# Patient Record
Sex: Female | Born: 1945 | Race: White | Hispanic: No | Marital: Married | Smoking: Former smoker
Health system: Southern US, Community
[De-identification: ages and names within clinical notes are randomized; demographics above are authoritative.]

## PROBLEM LIST (undated history)

## (undated) DIAGNOSIS — E162 Hypoglycemia, unspecified: Secondary | ICD-10-CM

## (undated) DIAGNOSIS — O00109 Unspecified tubal pregnancy without intrauterine pregnancy: Secondary | ICD-10-CM

## (undated) DIAGNOSIS — H269 Unspecified cataract: Secondary | ICD-10-CM

## (undated) DIAGNOSIS — Z1211 Encounter for screening for malignant neoplasm of colon: Secondary | ICD-10-CM

## (undated) HISTORY — DX: Unspecified tubal pregnancy without intrauterine pregnancy: O00.109

## (undated) HISTORY — PX: CATARACT EXTRACTION W/ INTRAOCULAR LENS IMPLANT: SHX1309

## (undated) HISTORY — DX: Unspecified cataract: H26.9

## (undated) HISTORY — PX: ECTOPIC PREGNANCY SURGERY: SHX613

## (undated) HISTORY — DX: Encounter for screening for malignant neoplasm of colon: Z12.11

---

## 2011-02-07 LAB — HM COLONOSCOPY

## 2012-05-24 LAB — LIPID PANEL
Cholesterol: 252 — AB (ref 0–200)
LDL Cholesterol: 128
Triglycerides: 72 (ref 40–160)

## 2012-05-24 LAB — CBC AND DIFFERENTIAL
HCT: 39 (ref 36–46)
Hemoglobin: 12.9 (ref 12.0–16.0)
Platelets: 180 (ref 150–399)
WBC: 4.2

## 2012-05-24 LAB — IRON,TIBC AND FERRITIN PANEL
%SAT: 38
Ferritin: 133
Iron: 97
TIBC: 257

## 2012-05-24 LAB — HEPATIC FUNCTION PANEL
ALT: 28 (ref 7–35)
AST: 30 (ref 13–35)
Alkaline Phosphatase: 73 (ref 25–125)
Bilirubin, Total: 0.4

## 2012-05-24 LAB — BASIC METABOLIC PANEL
BUN: 25 — AB (ref 4–21)
CO2: 26 — AB (ref 13–22)
Chloride: 103 (ref 99–108)
Creatinine: 0.6 (ref 0.5–1.1)
Glucose: 86
Potassium: 4 (ref 3.4–5.3)
Sodium: 139 (ref 137–147)

## 2012-05-24 LAB — HEMOGLOBIN A1C: Hemoglobin A1C: 5.6

## 2012-05-24 LAB — COMPREHENSIVE METABOLIC PANEL
Albumin: 3.6 (ref 3.5–5.0)
Calcium: 9.1 (ref 8.7–10.7)
GFR calc Af Amer: 122
GFR calc non Af Amer: 101

## 2012-05-24 LAB — CBC: RBC: 4.34 (ref 3.87–5.11)

## 2012-05-24 LAB — TSH: TSH: 1.24 (ref 0.41–5.90)

## 2012-05-24 LAB — VITAMIN B12: Vitamin B-12: 1387

## 2012-05-24 LAB — VITAMIN D 25 HYDROXY (VIT D DEFICIENCY, FRACTURES): Vit D, 25-Hydroxy: 27

## 2014-06-15 LAB — LIPID PANEL
Cholesterol: 274 — AB (ref 0–200)
HDL: 117 — AB (ref 35–70)
LDL Cholesterol: 143
Triglycerides: 57 (ref 40–160)

## 2014-06-15 LAB — CBC AND DIFFERENTIAL
HCT: 42 (ref 36–46)
Hemoglobin: 13.5 (ref 12.0–16.0)
Platelets: 157 (ref 150–399)
WBC: 5.3

## 2014-06-15 LAB — COMPREHENSIVE METABOLIC PANEL
Albumin: 3.9 (ref 3.5–5.0)
Calcium: 9.6 (ref 8.7–10.7)
GFR calc Af Amer: 87
GFR calc non Af Amer: 72

## 2014-06-15 LAB — BASIC METABOLIC PANEL
BUN: 29 — AB (ref 4–21)
CO2: 31 — AB (ref 13–22)
Chloride: 101 (ref 99–108)
Creatinine: 0.8 (ref 0.5–1.1)
Glucose: 95
Potassium: 5.2 (ref 3.4–5.3)
Sodium: 136 — AB (ref 137–147)

## 2014-06-15 LAB — CBC: RBC: 4.61 (ref 3.87–5.11)

## 2014-06-15 LAB — HEPATIC FUNCTION PANEL
ALT: 29 (ref 7–35)
AST: 31 (ref 13–35)
Alkaline Phosphatase: 86 (ref 25–125)
Bilirubin, Total: 0.3

## 2014-06-15 LAB — VITAMIN D 25 HYDROXY (VIT D DEFICIENCY, FRACTURES): Vit D, 25-Hydroxy: 67

## 2014-06-15 LAB — TSH: TSH: 1.92 (ref 0.41–5.90)

## 2015-02-22 LAB — BASIC METABOLIC PANEL
BUN: 24 — AB (ref 4–21)
CO2: 28 — AB (ref 13–22)
Chloride: 97 — AB (ref 99–108)
Creatinine: 0.5 (ref 0.5–1.1)
Potassium: 4.3 (ref 3.4–5.3)
Sodium: 136 — AB (ref 137–147)

## 2015-02-22 LAB — HEPATIC FUNCTION PANEL
ALT: 20 (ref 7–35)
AST: 27 (ref 13–35)
Alkaline Phosphatase: 67 (ref 25–125)
Bilirubin, Total: 0.2

## 2015-02-22 LAB — CBC AND DIFFERENTIAL
HCT: 40 (ref 36–46)
Hemoglobin: 13 (ref 12.0–16.0)
Platelets: 133 — AB (ref 150–399)
WBC: 4.8

## 2015-02-22 LAB — CBC: RBC: 4.48 (ref 3.87–5.11)

## 2015-02-22 LAB — COMPREHENSIVE METABOLIC PANEL
Albumin: 4.2 (ref 3.5–5.0)
Calcium: 9 (ref 8.7–10.7)
GFR calc Af Amer: 117
GFR calc non Af Amer: 101

## 2015-02-22 LAB — LIPID PANEL
Cholesterol: 255 — AB (ref 0–200)
HDL: 115 — AB (ref 35–70)
LDL Cholesterol: 124
LDl/HDL Ratio: 1.1
Triglycerides: 48 (ref 40–160)

## 2015-02-22 LAB — VITAMIN D 25 HYDROXY (VIT D DEFICIENCY, FRACTURES): Vit D, 25-Hydroxy: 34

## 2017-01-14 ENCOUNTER — Encounter: Payer: Self-pay | Admitting: Internal Medicine

## 2017-10-17 ENCOUNTER — Other Ambulatory Visit: Payer: Self-pay

## 2017-10-17 ENCOUNTER — Ambulatory Visit
Admission: EM | Admit: 2017-10-17 | Discharge: 2017-10-17 | Disposition: A | Discharge status: Home / Self Care | Payer: Medicare Other

## 2017-10-17 ENCOUNTER — Encounter: Payer: Self-pay | Admitting: Emergency Medicine

## 2017-10-17 DIAGNOSIS — H02843 Edema of right eye, unspecified eyelid: Secondary | ICD-10-CM

## 2017-10-17 DIAGNOSIS — H1031 Unspecified acute conjunctivitis, right eye: Secondary | ICD-10-CM | POA: Diagnosis not present

## 2017-10-17 HISTORY — DX: Hypoglycemia, unspecified: E16.2

## 2017-10-17 MED ORDER — GENTAMICIN SULFATE 0.3 % OP SOLN: 2.0000 [drp] | Freq: Four times a day (QID) | OPHTHALMIC | 0 refills | Status: AC

## 2017-10-17 NOTE — ED Provider Notes (Signed)
[facility]    CSN: [id number] Arrival date & time: 10/17/17  1003     History   Chief Complaint Chief Complaint  Patient presents with  . Eye Pain    stye vs. conjunctivitis    HPI Mary Everett is a 71 y.o. female.   71 year old female presents with right eye irritation and discharge that started yesterday. Had noticed her right eyelid was swollen and red- uncertain if she was developing a stye. Woke up this morning with some yellowish drainage and itching. Denies any fever, blurred vision or URI symptoms. Does not wear contacts. Has not used eye drops or any other treatment for symptoms yet. Only other chronic health issue is seasonal allergies and low blood pressure and takes Zyrtec as needed.    The history is provided by the patient.    Past Medical History:  Diagnosis Date  . Hypoglycemia     There are no active problems to display for this patient.   Past Surgical History:  Procedure Laterality Date  . ECTOPIC PREGNANCY SURGERY      OB History    No data available       Home Medications    Prior to Admission medications   Medication Sig Start Date End Date Taking? Authorizing Provider  cetirizine (ZYRTEC) 10 MG tablet Take 10 mg by mouth daily as needed for allergies.   Yes [provider]  gentamicin (GARAMYCIN) 0.3 % ophthalmic solution Place 2 drops into the right eye 4 (four) times daily for 5 days. 10/17/17 10/22/17  Katy Apo, NP    Family History Family History  Problem Relation Age of Onset  . Cancer Mother 42       lung  . Cancer Father 11       brain    Social History Social History   Tobacco Use  . Smoking status: Former Smoker    Last attempt to quit: 10/17/1972    Years since quitting: 45.0  . Smokeless tobacco: Never Used  Substance Use Topics  . Alcohol use: No    Frequency: Never  . Drug use: No     Allergies   Sulfa antibiotics   Review of Systems Review of Systems  Constitutional:  Negative for activity change, appetite change, chills, fatigue and fever.  HENT: Negative for congestion, ear discharge, ear pain, mouth sores, postnasal drip, rhinorrhea, sinus pressure, sinus pain, sneezing, sore throat and trouble swallowing.   Eyes: Positive for pain (irritation), discharge, redness and itching. Negative for photophobia and visual disturbance.  Respiratory: Negative for cough, chest tightness and shortness of breath.   Musculoskeletal: Negative for arthralgias, myalgias, neck pain and neck stiffness.  Skin: Negative for rash and wound.  Allergic/Immunologic: Negative for immunocompromised state.  Neurological: Negative for dizziness, tremors, seizures, syncope, weakness, light-headedness, numbness and headaches.  Hematological: Negative for adenopathy. Does not bruise/bleed easily.     Physical Exam Triage Vital Signs ED Triage Vitals  Enc Vitals Group     BP 10/17/17 1016 (!) 96/41     Pulse Rate 10/17/17 1016 72     Resp 10/17/17 1016 16     Temp 10/17/17 1016 98.3 F (36.8 C)     Temp Source 10/17/17 1016 Oral     SpO2 10/17/17 1016 99 %     Weight 10/17/17 1015 115 lb (52.2 kg)     Height 10/17/17 1015 5\' 3"  (1.6 m)     Head Circumference --  Peak Flow --      Pain Score 10/17/17 1016 0     Pain Loc --      Pain Edu? --      Excl. in [place]? --    No data found.  Updated Vital Signs BP (!) 96/41 (BP Location: Left Arm)   Pulse 72   Temp 98.3 F (36.8 C) (Oral)   Resp 16   Ht 5\' 3"  (1.6 m)   Wt 115 lb (52.2 kg)   SpO2 99%   BMI 20.37 kg/m   Visual Acuity Right Eye Distance:   Left Eye Distance:   Bilateral Distance:    Right Eye Near:   Left Eye Near:    Bilateral Near:     Physical Exam  Constitutional: She is oriented to person, place, and time. She appears well-developed and well-nourished. No distress.  HENT:  Head: Normocephalic and atraumatic.  Right Ear: Hearing, tympanic membrane, external ear and ear canal normal.  Left Ear:  Hearing, tympanic membrane, external ear and ear canal normal.  Nose: Nose normal. Right sinus exhibits no maxillary sinus tenderness and no frontal sinus tenderness. Left sinus exhibits no maxillary sinus tenderness and no frontal sinus tenderness.  Mouth/Throat: Uvula is midline, oropharynx is clear and moist and mucous membranes are normal.  Eyes: EOM are normal. Pupils are equal, round, and reactive to light. Right eye exhibits discharge (slight yellow). No foreign body present in the right eye. Right conjunctiva is injected.  Fundoscopic exam:      The right eye shows no exudate. The right eye shows red reflex.       The left eye shows no exudate. The left eye shows red reflex.    Slight swelling and redness of right upper eyelid- more medial area. Possible early stye present in internal eyelid at medial edge. Slight red conjunctiva.   Neck: Normal range of motion. Neck supple.  Cardiovascular: Normal rate, regular rhythm and normal heart sounds.  Pulmonary/Chest: Effort normal and breath sounds normal. No respiratory distress.  Lymphadenopathy:    She has no cervical adenopathy.  Neurological: She is alert and oriented to person, place, and time.  Skin: Skin is warm and dry.  Psychiatric: She has a normal mood and affect. Her behavior is normal. Judgment and thought content normal.     UC Treatments / Results  Labs (all labs ordered are listed, but only abnormal results are displayed) Labs Reviewed - No data to display  EKG  EKG Interpretation None       Radiology No results found.  Procedures Procedures (including critical care time)  Medications Ordered in UC Medications - No data to display   Initial Impression / Assessment and Plan / UC Course  I have reviewed the triage vital signs and the nursing notes.  Pertinent labs & imaging results that were available during my care of the patient were reviewed by me and considered in my medical decision making (see  chart for details).    Discussed that she may have a mild conjunctivitis and the beginnings of a stye. Recommend start Gentamicin eye drops- 2 drops in right eye every 4 to 6 hours for the next 5 days. May apply warm compresses and alternate with cool compresses for comfort. Wash hands frequently. Recommend follow-up in 3 days with your PCP if not improving.    Final Clinical Impressions(s) / UC Diagnoses   Final diagnoses:  Acute bacterial conjunctivitis of right eye  Swelling of right eyelid  ED Discharge Orders        Ordered    gentamicin (GARAMYCIN) 0.3 % ophthalmic solution  4 times daily     10/17/17 1041       Controlled Substance Prescriptions [place] Controlled Substance Registry consulted? Not Applicable   Katy Apo, NP 10/17/17 1624

## 2017-10-17 NOTE — ED Triage Notes (Signed)
Patient in today c/o 1 day history of eye pain, redness, drainage and itching. Patient concerned she has had a stye and conjunctivitis before.

## 2017-10-17 NOTE — Discharge Instructions (Addendum)
Recommend start Gentamicin eye drops- 2 drops in right eye every 4 to 6 hours for the next 5 days. May apply warm compresses and alternate with cool compresses for comfort. Recommend follow-up in 3 days with your PCP if not improving.

## 2017-11-21 DIAGNOSIS — M810 Age-related osteoporosis without current pathological fracture: Secondary | ICD-10-CM | POA: Insufficient documentation

## 2017-11-21 DIAGNOSIS — M8589 Other specified disorders of bone density and structure, multiple sites: Secondary | ICD-10-CM | POA: Insufficient documentation

## 2017-11-21 DIAGNOSIS — G8929 Other chronic pain: Secondary | ICD-10-CM | POA: Insufficient documentation

## 2017-11-21 HISTORY — DX: Age-related osteoporosis without current pathological fracture: M81.0

## 2018-05-19 DIAGNOSIS — W57XXXA Bitten or stung by nonvenomous insect and other nonvenomous arthropods, initial encounter: Secondary | ICD-10-CM | POA: Insufficient documentation

## 2018-05-25 DIAGNOSIS — R599 Enlarged lymph nodes, unspecified: Secondary | ICD-10-CM | POA: Insufficient documentation

## 2018-10-26 DIAGNOSIS — J0100 Acute maxillary sinusitis, unspecified: Secondary | ICD-10-CM | POA: Insufficient documentation

## 2019-07-12 DIAGNOSIS — Z23 Encounter for immunization: Secondary | ICD-10-CM | POA: Diagnosis not present

## 2019-07-12 DIAGNOSIS — L0231 Cutaneous abscess of buttock: Secondary | ICD-10-CM | POA: Diagnosis not present

## 2019-07-20 DIAGNOSIS — H43813 Vitreous degeneration, bilateral: Secondary | ICD-10-CM | POA: Diagnosis not present

## 2019-07-22 DIAGNOSIS — R69 Illness, unspecified: Secondary | ICD-10-CM | POA: Diagnosis not present

## 2019-08-09 ENCOUNTER — Other Ambulatory Visit: Payer: Self-pay

## 2019-08-09 ENCOUNTER — Ambulatory Visit (INDEPENDENT_AMBULATORY_CARE_PROVIDER_SITE_OTHER): Payer: Medicare HMO | Admitting: Internal Medicine

## 2019-08-09 ENCOUNTER — Encounter: Payer: Self-pay | Admitting: Internal Medicine

## 2019-08-09 VITALS — Ht 63.0 in | Wt 110.0 lb

## 2019-08-09 DIAGNOSIS — Z1231 Encounter for screening mammogram for malignant neoplasm of breast: Secondary | ICD-10-CM

## 2019-08-09 DIAGNOSIS — Z1211 Encounter for screening for malignant neoplasm of colon: Secondary | ICD-10-CM

## 2019-08-09 DIAGNOSIS — Z13220 Encounter for screening for lipoid disorders: Secondary | ICD-10-CM | POA: Diagnosis not present

## 2019-08-09 DIAGNOSIS — L0231 Cutaneous abscess of buttock: Secondary | ICD-10-CM

## 2019-08-09 NOTE — Progress Notes (Signed)
Patient ID: Mary Everett, female   DOB: 02/20/1946, 73 y.o.   MRN: [id number]   Virtual Visit via video Note  This visit type was conducted due to national recommendations for restrictions regarding the COVID-19 pandemic (e.g. social distancing).  This format is felt to be most appropriate for this patient at this time.  All issues noted in this document were discussed and addressed.  No physical exam was performed (except for noted visual exam findings with Video Visits).   I connected with Mary Everett by a video enabled telemedicine application and verified that I am speaking with the correct person using two identifiers. Location patient: home Location provider: work Persons participating in the virtual visit: patient, provider  I discussed the limitations, risks, security and privacy concerns of performing an evaluation and management service by video and the availability of in person appointments.  The patient expressed understanding and agreed to proceed.   Reason for visit: establish care.   HPI: Here to establish care. Previously seeing Talitha Givens. States had a history of headache and some depression in her 39s.  Reports that after work up, found related to sugar and carbs.  She tries to avoid sugar and carbs now (and has for years) and has no problems if she watches what she eats.  She walks regularly. No chest pain. No sob.  No acid reflux.  No abdominal pain.  Bowels moving.  She has two children.  Son lives in [place].  Recently diagnosed with covid.  She has not been around him recently, but did walk outside with his children.  She is not having any symptoms and her grandchildren are not sick.  They are going to be tested.  Her daughter lives in [place].  States her last colonoscopy was in [place] [facility]).  Is due a mammogram.  Handling stress.  Overall feels good.  She does have a lesion - buttock.  Recently treated with doxycycline.  Improved, but now worsening.  She will send  me a picture once she gets my chart connected.     ROS: See pertinent positives and negatives per HPI.  Past Medical History:  Diagnosis Date  . Cataract   . Hypoglycemia   . Tubal pregnancy     Past Surgical History:  Procedure Laterality Date  . ECTOPIC PREGNANCY SURGERY      Family History  Problem Relation Age of Onset  . Cancer Mother 4       lung  . Cancer Father 55       brain  . Memory loss Sister     SOCIAL HX: reviewed.    Current Outpatient Medications:  .  Cholecalciferol (VITAMIN D3) 25 MCG (1000 UT) CAPS, Take by mouth., Disp: , Rfl:  .  doxycycline (VIBRA-TABS) 100 MG tablet, Take 1 tablet (100 mg total) by mouth 2 (two) times daily., Disp: 14 tablet, Rfl: 0 .  Magnesium Oxide 200 MG TABS, magnesium  1 tablet per day, Disp: , Rfl:  .  Multiple Vitamins-Minerals (PX COMPLETE SENIOR MULTIVITS) TABS, multivitamin  1 tablet per day, Disp: , Rfl:  .  Omega-3 Fatty Acids (RA FISH OIL) 1000 MG CAPS, Fish Oil  2 capsules per day, Disp: , Rfl:   EXAM:  GENERAL: alert, oriented, appears well and in no acute distress  HEENT: atraumatic, conjunttiva clear, no obvious abnormalities on inspection of external nose and ears  NECK: normal movements of the head and neck  LUNGS: on inspection no signs of respiratory distress,  breathing rate appears normal, no obvious gross SOB, gasping or wheezing  CV: no obvious cyanosis  PSYCH/NEURO: pleasant and cooperative, no obvious depression or anxiety, speech and thought processing grossly intact  ASSESSMENT AND PLAN:  Discussed the following assessment and plan:  Abscess of buttock Recently treated and improved.  Worsening now. She sent picture through my chart.  Treat with doxycyline.  Discussed surgery referral. She wants to follow.  Will update me over the next week.    Colon cancer screening She had last colonoscopy in [place].  Will get me the information so we can get a copy of colonoscopy report.    Breast  cancer screening Has performed at [facility].  Agreeable to schedule at [place] [facility]).      I discussed the assessment and treatment plan with the patient. The patient was provided an opportunity to ask questions and all were answered. The patient agreed with the plan and demonstrated an understanding of the instructions.   The patient was advised to call back or seek an in-person evaluation if the symptoms worsen or if the condition fails to improve as anticipated.   Einar Pheasant, MD

## 2019-08-10 ENCOUNTER — Encounter: Payer: Self-pay | Admitting: Internal Medicine

## 2019-08-10 NOTE — Telephone Encounter (Signed)
Faxed record request.

## 2019-08-10 NOTE — Telephone Encounter (Signed)
Please call pt and let her know that given the persistent lesion, I would like to refer her to surgery for further evaluation and treatment.  Also, I do recommend another round of doxycycline prior to seeing the surgeon.  Confirm she had no problems taking doxycycline.  If no, then doxycycline 100mg  bid x 1 week.  will need rx sent in.  Needs to avoid increased sun exposure.  Also, take a probiotic while on the abx and for two weeks after completing the abx.

## 2019-08-10 NOTE — Telephone Encounter (Signed)
Left message for pt to call back regarding below

## 2019-08-10 NOTE — Telephone Encounter (Signed)
Pt has called back and wants a return call at [phone]

## 2019-08-11 ENCOUNTER — Other Ambulatory Visit: Payer: Self-pay

## 2019-08-11 MED ORDER — DOXYCYCLINE HYCLATE 100 MG PO TABS: 100.0000 mg | ORAL_TABLET | Freq: Two times a day (BID) | ORAL | 0 refills | Status: DC

## 2019-08-11 NOTE — Telephone Encounter (Signed)
Patient aware. I have sent in doxy 100 bid x1wk and advised patient of message below. Confirmed no issues taking doxycycline. Sent to [facility] in [place] per pt request. She did ask if the spot got better, can we wait on surgery referral? Patient is going to send me an update beginning of next week.

## 2019-08-11 NOTE — Telephone Encounter (Signed)
Noted. Please call and notify pt that I want her to take probiotics daily while on abx and for two weeks after completing abx.  Agree with keeping [place] posted.

## 2019-08-12 NOTE — Telephone Encounter (Signed)
She was notified of that with original message.

## 2019-08-15 ENCOUNTER — Encounter: Payer: Self-pay | Admitting: Internal Medicine

## 2019-08-15 DIAGNOSIS — L0231 Cutaneous abscess of buttock: Secondary | ICD-10-CM

## 2019-08-15 DIAGNOSIS — Z1239 Encounter for other screening for malignant neoplasm of breast: Secondary | ICD-10-CM | POA: Insufficient documentation

## 2019-08-15 DIAGNOSIS — Z1211 Encounter for screening for malignant neoplasm of colon: Secondary | ICD-10-CM | POA: Insufficient documentation

## 2019-08-15 NOTE — Assessment & Plan Note (Signed)
Has performed at [facility].  Agreeable to schedule at [facility]).

## 2019-08-15 NOTE — Assessment & Plan Note (Signed)
She had last colonoscopy in [place].  Will get me the information so we can get a copy of colonoscopy report.

## 2019-08-15 NOTE — Assessment & Plan Note (Signed)
Recently treated and improved.  Worsening now. She sent picture through my chart.  Treat with doxycyline.  Discussed surgery referral. She wants to follow.  Will update me over the next week.

## 2019-08-16 NOTE — Telephone Encounter (Signed)
Order placed for surgery referral.  

## 2019-08-16 NOTE — Telephone Encounter (Signed)
Yes patient has been called

## 2019-08-16 NOTE — Telephone Encounter (Signed)
See other my chart message regarding referral to surgery.

## 2019-08-17 ENCOUNTER — Encounter: Payer: Self-pay | Admitting: Internal Medicine

## 2019-08-19 ENCOUNTER — Encounter: Payer: Self-pay | Admitting: General Surgery

## 2019-08-19 ENCOUNTER — Ambulatory Visit: Payer: Medicare HMO | Admitting: General Surgery

## 2019-08-19 ENCOUNTER — Other Ambulatory Visit: Payer: Self-pay

## 2019-08-19 VITALS — BP 117/76 | HR 65 | Temp 94.6°F | Resp 12 | Ht 63.0 in | Wt 110.6 lb

## 2019-08-19 DIAGNOSIS — L0231 Cutaneous abscess of buttock: Secondary | ICD-10-CM

## 2019-08-19 NOTE — Progress Notes (Signed)
Patient ID: Mary Everett, female   DOB: Mar 01, 1946, 73 y.o.   MRN: [id number]  Chief Complaint  Patient presents with  . New Patient (Initial Visit)    buttock abscess    HPI Mary Everett is a 73 y.o. female.   She has been referred by her primary care provider, Dr. Nicki Reaper, for evaluation of an abscess on her right buttock.  She reports that she first noticed it at the end of August.  She dealt with it for a while and treated it conservatively at home with warm compresses and sitz baths.  She ultimately sought the attention of her physician and was prescribed antibiotics with some improvement.  About 2 weeks later, the lesion came back worse.  She was prescribed doxycycline and just completed this course yesterday.  She says that the redness in the area has decreased and there has not been any spontaneous drainage.  The site is sensitive but the discomfort is localized.  She denies any fevers or chills.  She says that she and her husband are very active walkers and she notices the area rubbing or chafing against the opposite buttock.   Past Medical History:  Diagnosis Date  . Cataract   . Hypoglycemia   . Tubal pregnancy     Past Surgical History:  Procedure Laterality Date  . ECTOPIC PREGNANCY SURGERY      Family History  Problem Relation Age of Onset  . Cancer Mother 69       lung  . Cancer Father 33       brain  . Memory loss Sister     Social History Social History   Tobacco Use  . Smoking status: Former Smoker    Quit date: 10/17/1972    Years since quitting: 46.8  . Smokeless tobacco: Never Used  Substance Use Topics  . Alcohol use: No    Frequency: Never  . Drug use: No    Allergies  Allergen Reactions  . Sulfa Antibiotics Rash    Current Outpatient Medications  Medication Sig Dispense Refill  . Cholecalciferol (VITAMIN D3) 25 MCG (1000 UT) CAPS Take by mouth.    . Magnesium Oxide 200 MG TABS magnesium  1 tablet per day    . Multiple Vitamins-Minerals (PX  COMPLETE SENIOR MULTIVITS) TABS multivitamin  1 tablet per day    . Omega-3 Fatty Acids (RA FISH OIL) 1000 MG CAPS Fish Oil  2 capsules per day     No current facility-administered medications for this visit.     Review of Systems Review of Systems  All other systems reviewed and are negative.   Blood pressure 117/76, pulse 65, temperature (!) 94.6 F (34.8 C), temperature source Temporal, resp. rate 12, height 5\' 3"  (1.6 m), weight 110 lb 9.6 oz (50.2 kg), SpO2 96 %. Body mass index is 19.59 kg/m.   Physical Exam Physical Exam Vitals signs reviewed. Exam conducted with a chaperone present.  Constitutional:      Appearance: Normal appearance.     Comments: She is extremely thin.  HENT:     Head: Normocephalic and atraumatic.     Nose:     Comments: Covered with a mask secondary to COVID-19 precautions    Mouth/Throat:     Comments: Covered with a mask secondary to COVID-19 precautions Neck:     Musculoskeletal: No neck rigidity.     Comments: No thyromegaly or dominant thyroid masses appreciated. Cardiovascular:     Rate and Rhythm: Normal rate and regular  rhythm.     Pulses: Normal pulses.  Pulmonary:     Effort: Pulmonary effort is normal.     Breath sounds: Normal breath sounds.  Abdominal:     General: Abdomen is flat. Bowel sounds are normal.     Palpations: Abdomen is soft.  Genitourinary:      Comments: There is a rounded slightly raised and erythematous area just lateral to the labia and anus on the right.  There is no purulent drainage identified, but the site is slightly fluctuant.  It is tender to palpation. Musculoskeletal:        General: No swelling.  Lymphadenopathy:     Cervical: No cervical adenopathy.  Skin:    General: Skin is warm and dry.  Neurological:     General: No focal deficit present.     Mental Status: She is alert and oriented to person, place, and time.  Psychiatric:        Mood and Affect: Mood normal.        Behavior: Behavior  normal.     Data Reviewed I have reviewed Dr. Bary Leriche clinic and patient message notes, as well as an outside internal medicine note dated 07/12/2019.  These describe the appearance of the lesion including a photograph sent by the patient and the plan of action including antibiotics, warm compresses and sitz baths.  The most recent note from Dr. Nicki Reaper relates the referral to general surgery.  Assessment This is a 73 year old woman with a persistent abscess on her right buttock.  It is in an area of friction and the patient is an avid walker.  She has undergone treatment with antibiotics.  Conservative management seems to have failed.  I have recommended incision and drainage of the site.  Consent was obtained and the procedure performed as documented here:  Incision and Drainage Procedure Note  Pre-operative Diagnosis: Cutaneous abscess of buttock  Post-operative Diagnosis: same  Indications: Is a 73 year old woman with a persistent abscess that has not resolved with conservative measures.  Anesthesia: 1% lidocaine with epinephrine  Procedure Details  The procedure, risks and complications have been discussed in detail (including, but not limited to airway compromise, infection, bleeding) with the patient, and the patient has signed consent to the procedure.  The skin was sterilely prepped and draped over the affected area in the usual fashion. After adequate local anesthesia, I&D with a #11 blade was performed on the right buttock. Purulent drainage: absent.  The site was probed and a small amount of fluid was cultured.  No loculations were identified. The patient was observed until stable.  Findings: No gross purulent material  EBL: Less than 5 cc's  Drains: None  Condition: Tolerated procedure well   Complications: none.    Plan No purulent material was obtained from the site today.  I did take a culture and should there be any growth, I will contact the patient and  prescribe appropriate antibiotic treatment.  I have recommended that she perform sitz bath's at least twice per day and after every bowel movement.  She may allow warm soapy water to flow into the site to keep it clean when she showers.  She may use a dressing over the site to minimize any soilage on her underclothing and also to prevent rubbing against the other buttock.  Once this is healed, I recommend using an antichafing stick, as is frequently done by long distance runners, to minimize trauma to the area and hopefully prevent recurrence.  If she  has no concerns and the site seems to be healing up well, she does not need to return for recheck.  If she has any questions or concerns, however, she may contact her office and we will see her.    Fredirick Maudlin 08/19/2019, 3:49 PM

## 2019-08-19 NOTE — Patient Instructions (Signed)
Be sure to take sitz baths daily. Please spray your self off there after each bowel movement.  Keep a dressing over the area until it closes completely.  You may use Tylenol or Ibuprofen for the discomfort.   Please call our office if you do not see improvement.    How to Take a [employer] A sitz bath is a warm water bath that may be used to care for your rectum, genital area, or the area between your rectum and genitals (perineum). For a sitz bath, the water only comes up to your hips and covers your buttocks. A sitz bath may done at home in a bathtub or with a portable sitz bath that fits over the toilet. Your health care provider may recommend a sitz bath to help:  Relieve pain and discomfort after delivering a baby.  Relieve pain and itching from hemorrhoids or anal fissures.  Relieve pain after certain surgeries.  Relax muscles that are sore or tight. How to take a sitz bath Take 3-4 sitz baths a day, or as many as told by your health care provider. Bathtub sitz bath To take a sitz bath in a bathtub: 1. Partially fill a bathtub with warm water. The water should be deep enough to cover your hips and buttocks when you are sitting in the tub. 2. If your health care provider told you to put medicine in the water, follow his or her instructions. 3. Sit in the water. 4. Open the tub drain a little, and leave it open during your bath. 5. Turn on the warm water again, enough to replace the water that is draining out. Keep the water running throughout your bath. This helps keep the water at the right level and the right temperature. 6. Soak in the water for 15-20 minutes, or as long as told by your health care provider. 7. When you are done, be careful when you stand up. You may feel dizzy. 8. After the sitz bath, pat yourself dry. Do not rub your skin to dry it.  Over-the-toilet sitz bath To take a sitz bath with an over-the-toilet basin: 1. Follow the manufacturer's instructions. 2.  Fill the basin with warm water. 3. If your health care provider told you to put medicine in the water, follow his or her instructions. 4. Sit on the seat. Make sure the water covers your buttocks and perineum. 5. Soak in the water for 15-20 minutes, or as long as told by your health care provider. 6. After the sitz bath, pat yourself dry. Do not rub your skin to dry it. 7. Clean and dry the basin between uses. 8. Discard the basin if it cracks, or according to the manufacturer's instructions. Contact a health care provider if:  Your symptoms get worse. Do not continue with sitz baths if your symptoms get worse.  You have new symptoms. If this happens, do not continue with sitz baths until you talk with your health care provider. Summary  A sitz bath is a warm water bath in which the water only comes up to your hips and covers your buttocks.  A sitz bath may help relieve itching, relieve pain, and relax muscles that are sore or tight in the lower part of your body, including your genital area.  Take 3-4 sitz baths a day, or as many as told by your health care provider. Soak in the water for 15-20 minutes.  Do not continue with sitz baths if your symptoms get worse. This information  is not intended to replace advice given to you by your health care provider. Make sure you discuss any questions you have with your health care provider. Document Released: 07/06/2004 Document Revised: 10/16/2017 Document Reviewed: 10/16/2017 Elsevier Patient Education  2020 [employer].

## 2019-08-24 LAB — ANAEROBIC AND AEROBIC CULTURE

## 2019-08-30 ENCOUNTER — Encounter: Payer: Self-pay | Admitting: Internal Medicine

## 2019-09-02 ENCOUNTER — Ambulatory Visit: Payer: Medicare HMO | Admitting: General Surgery

## 2019-09-02 ENCOUNTER — Encounter: Payer: Self-pay | Admitting: General Surgery

## 2019-09-02 ENCOUNTER — Telehealth: Payer: Self-pay

## 2019-09-02 ENCOUNTER — Other Ambulatory Visit: Payer: Self-pay

## 2019-09-02 VITALS — BP 114/75 | HR 64 | Temp 95.4°F | Resp 14 | Ht 63.0 in | Wt 110.6 lb

## 2019-09-02 DIAGNOSIS — L0231 Cutaneous abscess of buttock: Secondary | ICD-10-CM | POA: Diagnosis not present

## 2019-09-02 MED ORDER — CEPHALEXIN 500 MG PO CAPS: 500.0000 mg | ORAL_CAPSULE | Freq: Four times a day (QID) | ORAL | 0 refills | Status: AC

## 2019-09-02 NOTE — Patient Instructions (Signed)
   Follow-up with our office as needed.  Please call and ask to speak with a nurse if you develop questions or concerns.  

## 2019-09-02 NOTE — Progress Notes (Signed)
Mary Everett returns to clinic today for reevaluation of the right buttock abscess that I drained 2 weeks ago.  She says that it was fine until yesterday when she noticed pain, inflammation, and redness in the area.  She states that she has not been performing sitz bath's since a couple of days after the incision and drainage.  She is currently not taking any antibiotics.  2 weeks ago, when I saw her, there was no pus present.  A culture was taken but there was no growth.  Today, she denies fevers or chills.  No nausea or vomiting.  Her only complaint is the discomfort at the site of the abscess.  Past Medical History:  Diagnosis Date  . Cataract   . Hypoglycemia   . Tubal pregnancy    Past Surgical History:  Procedure Laterality Date  . ECTOPIC PREGNANCY SURGERY     Family History  Problem Relation Age of Onset  . Cancer Mother 36       lung  . Cancer Father 74       brain  . Memory loss Sister    Social History   Socioeconomic History  . Marital status: Married    Spouse name: Not on file  . Number of children: Not on file  . Years of education: Not on file  . Highest education level: Not on file  Occupational History  . Not on file  Social Needs  . Financial resource strain: Not on file  . Food insecurity    Worry: Not on file    Inability: Not on file  . Transportation needs    Medical: Not on file    Non-medical: Not on file  Tobacco Use  . Smoking status: Former Smoker    Quit date: 10/17/1972    Years since quitting: 46.9  . Smokeless tobacco: Never Used  Substance and Sexual Activity  . Alcohol use: No    Frequency: Never  . Drug use: No  . Sexual activity: Not on file  Lifestyle  . Physical activity    Days per week: Not on file    Minutes per session: Not on file  . Stress: Not on file  Relationships  . Social Herbalist on phone: Not on file    Gets together: Not on file    Attends religious service: Not on file    Active member of club  or organization: Not on file    Attends meetings of clubs or organizations: Not on file    Relationship status: Not on file  . Intimate partner violence    Fear of current or ex partner: Not on file    Emotionally abused: Not on file    Physically abused: Not on file    Forced sexual activity: Not on file  Other Topics Concern  . Not on file  Social History Narrative  . Not on file   Current Meds  Medication Sig  . Cholecalciferol (VITAMIN D3) 25 MCG (1000 UT) CAPS Take by mouth.  . Magnesium Oxide 200 MG TABS magnesium  1 tablet per day  . Multiple Vitamins-Minerals (PX COMPLETE SENIOR MULTIVITS) TABS multivitamin  1 tablet per day  . Omega-3 Fatty Acids (RA FISH OIL) 1000 MG CAPS Fish Oil  2 capsules per day   Allergies  Allergen Reactions  . Sulfa Antibiotics Rash   Today's Vitals   09/02/19 1002  BP: 114/75  Pulse: 64  Resp: 14  Temp: (!) 95.4 F (  35.2 C)  TempSrc: Temporal  SpO2: 99%  Weight: 110 lb 9.6 oz (50.2 kg)  Height: 5\' 3"  (1.6 m)  PainSc: 0-No pain   Body mass index is 19.59 kg/m. Gen: A&O x 3. NAD Pulm: normal WOB on RA CV: well-perfused, RRR Skin: The area of the previous incision and drainage is erythematous and somewhat fluctuant.  It is tender.  Impression and plan: This is a 73 year old woman who has had recurrence of an abscess that I drained 2 weeks ago.  At the time of the initial drainage procedure, no pus was appreciated and cultures were negative.  We will drain the abscess again today.  I prescribed Keflex for a total of 10 days and encouraged her to continue sitz baths on a more regular basis.  The lesion did not give the appearance of a sebaceous cyst at the time of our initial visit and appears more likely to be an infected hair follicle or similar lesion.  I will see her as needed.  Incision and Drainage Procedure Note  Pre-operative Diagnosis: Recurrent abscess  Post-operative Diagnosis: same  Indications: Recurrent  abscess  Anesthesia: 1% lidocaine with epinephrine  Procedure Details  The procedure, risks and complications have been discussed in detail (including, but not limited to airway compromise, infection, bleeding) with the patient, and the patient has signed consent to the procedure.  The skin was sterilely prepped and draped over the affected area in the usual fashion. After adequate local anesthesia, I&D with a #11 and 15 blade was performed on the right inner buttock. Purulent drainage: present The patient was observed until stable.  Findings: Pus appreciated on today's incision and drainage.  No findings suggestive of epidermal inclusion cyst.  Culture was sent.  EBL: Less than 5 cc's  Drains: none  Condition: Tolerated procedure well   Complications: none.

## 2019-09-02 NOTE — Telephone Encounter (Signed)
Patient calling stated she has an abscess- red very painful. Patient added to schedule per Dr.Cannon 09/02/2019 2 10 am. Patient has been taking sitz baths with no improvement. No antibiotics at this time.

## 2019-09-06 LAB — ANAEROBIC AND AEROBIC CULTURE

## 2019-09-07 ENCOUNTER — Telehealth: Payer: Self-pay | Admitting: General Surgery

## 2019-09-07 NOTE — Telephone Encounter (Signed)
I contacted the patient to let her know the results of the culture taken from her abscess.  Proteus mirabilis was present.  It is resistant to tetracyclines, which may explain why she had no response to the doxycycline she was previously on.  It shows some limited sensitivity to some cephalosporins, but she states that it is clearing up on the current regimen of Keflex.  She is continue to perform sits baths and keep the area clean.  She says the area is soft, flat, and nontender at this time.  She will contact me if the area worsens again, as I do have some alternate antibiotic options, if necessary.

## 2019-09-21 ENCOUNTER — Other Ambulatory Visit: Payer: Self-pay

## 2019-09-27 ENCOUNTER — Other Ambulatory Visit: Payer: Medicare HMO

## 2019-11-11 DIAGNOSIS — Z9289 Personal history of other medical treatment: Secondary | ICD-10-CM | POA: Diagnosis not present

## 2019-11-11 DIAGNOSIS — Z1231 Encounter for screening mammogram for malignant neoplasm of breast: Secondary | ICD-10-CM | POA: Diagnosis not present

## 2019-11-11 LAB — HM MAMMOGRAPHY

## 2019-11-16 ENCOUNTER — Other Ambulatory Visit: Payer: Self-pay

## 2019-11-16 ENCOUNTER — Ambulatory Visit (INDEPENDENT_AMBULATORY_CARE_PROVIDER_SITE_OTHER): Payer: Medicare HMO

## 2019-11-16 VITALS — Ht 63.0 in | Wt 110.0 lb

## 2019-11-16 DIAGNOSIS — Z0000 Encounter for general adult medical examination without abnormal findings: Secondary | ICD-10-CM | POA: Diagnosis not present

## 2019-11-16 NOTE — Progress Notes (Addendum)
Subjective:   Mary Everett is a 74 y.o. female who presents for an Initial Medicare Annual Wellness Visit.  Review of Systems    No ROS.  Medicare Wellness Virtual Visit.  Visual/audio telehealth visit, UTA vital signs.   Wt/Ht provided. See social history for additional risk factors.  Cardiac Risk Factors include: advanced age (>51men, >3 women)     Objective:    Today's Vitals   11/16/19 1136  Weight: 110 lb (49.9 kg)  Height: 5\' 3"  (1.6 m)   Body mass index is 19.49 kg/m.  Advanced Directives 11/16/2019  Does Patient Have a Medical Advance Directive? Yes  Type of Advance Directive Living will  Does patient want to make changes to medical advance directive? No - Patient declined    Current Medications (verified) Outpatient Encounter Medications as of 11/16/2019  Medication Sig  . Cholecalciferol (VITAMIN D3) 25 MCG (1000 UT) CAPS Take by mouth.  . Magnesium Oxide 200 MG TABS magnesium  1 tablet per day  . Multiple Vitamins-Minerals (PX COMPLETE SENIOR MULTIVITS) TABS multivitamin  1 tablet per day  . Omega-3 Fatty Acids (RA FISH OIL) 1000 MG CAPS Fish Oil  2 capsules per day   No facility-administered encounter medications on file as of 11/16/2019.    Allergies (verified) Sulfa antibiotics   History: Past Medical History:  Diagnosis Date  . Cataract   . Hypoglycemia   . Tubal pregnancy    Past Surgical History:  Procedure Laterality Date  . ECTOPIC PREGNANCY SURGERY     Family History  Problem Relation Age of Onset  . Cancer Mother 47       lung  . Cancer Father 57       brain  . Memory loss Sister    Social History   Socioeconomic History  . Marital status: Married    Spouse name: Not on file  . Number of children: Not on file  . Years of education: Not on file  . Highest education level: Not on file  Occupational History  . Not on file  Tobacco Use  . Smoking status: Former Smoker    Quit date: 10/17/1972    Years since quitting: 47.1   . Smokeless tobacco: Never Used  Substance and Sexual Activity  . Alcohol use: No  . Drug use: No  . Sexual activity: Not on file  Other Topics Concern  . Not on file  Social History Narrative  . Not on file   Social Determinants of Health   Financial Resource Strain: Low Risk   . Difficulty of Paying Living Expenses: Not hard at all  Food Insecurity: No Food Insecurity  . Worried About Charity fundraiser in the Last Year: Never true  . Ran Out of Food in the Last Year: Never true  Transportation Needs: No Transportation Needs  . Lack of Transportation (Medical): No  . Lack of Transportation (Non-Medical): No  Physical Activity: Sufficiently Active  . Days of Exercise per Week: 7 days  . Minutes of Exercise per Session: 120 min  Stress: No Stress Concern Present  . Feeling of Stress : Not at all  Social Connections: Not Isolated  . Frequency of Communication with Friends and Family: More than three times a week  . Frequency of Social Gatherings with Friends and Family: More than three times a week  . Attends Religious Services: 1 to 4 times per year  . Active Member of Clubs or Organizations: Yes  . Attends Archivist  Meetings: Not on file  . Marital Status: Married    Tobacco Counseling Counseling given: Not Answered   Clinical Intake:  Pre-visit preparation completed: Yes        Diabetes: No  How often do you need to have someone help you when you read instructions, pamphlets, or other written materials from your doctor or pharmacy?: 1 - Never  Interpreter Needed?: No      Activities of Daily Living In your present state of health, do you have any difficulty performing the following activities: 11/16/2019  Hearing? N  Vision? N  Difficulty concentrating or making decisions? N  Walking or climbing stairs? N  Dressing or bathing? N  Doing errands, shopping? N  Preparing Food and eating ? N  Using the Toilet? N  In the past six months, have  you accidently leaked urine? N  Do you have problems with loss of bowel control? N  Managing your Medications? N  Managing your Finances? N  Housekeeping or managing your Housekeeping? N  Some recent data might be hidden     Immunizations and Health Maintenance Immunization History  Administered Date(s) Administered  . Influenza, High Dose Seasonal PF 08/25/2018  . Influenza,inj,Quad PF,6+ Mos 07/12/2019  . Pneumococcal Polysaccharide-23 07/12/2019  . Td 05/21/2016   Health Maintenance Due  Topic Date Due  . Hepatitis C Screening  1945-11-24  . MAMMOGRAM  12/30/1997  . COLONOSCOPY  12/30/1997  . DEXA SCAN  12/30/2012    Patient Care Team: Einar Pheasant, MD as PCP - General (Internal Medicine)  Indicate any recent Medical Services you may have received from other than [facility] providers in the past year (date may be approximate).     Assessment:   This is a routine wellness examination for [name].  Nurse connected with patient 11/16/19 at 11:30 AM EST by a telephone enabled telemedicine application and verified that I am speaking with the correct person using two identifiers. Patient stated full name and DOB. Patient gave permission to continue with virtual visit. Patient's location was at home and Nurse's location was at [place] office.   Patient is alert and oriented x3. Patient denies difficulty focusing or concentrating.  Health Maintenance Due: -Colonoscopy- patient notes her last completed 7 years ago.  -Cologuard and Dexa Scan- discussed and plans to discuss with PMD at [facility]. -Mammogram- completed at [facility], [place]) 11/11/19  -Hep C screening- consent given. See completed HM at the end of note.   Eye: Visual acuity not assessed. Virtual visit. Followed by their ophthalmologist.  Dental: Visits every 6 months.    Hearing: Demonstrates normal hearing during visit.  Safety:  Patient feels safe at home- yes Patient does have smoke detectors at  home- yes Patient does wear sunscreen or protective clothing when in direct sunlight - yes Patient does wear seat belt when in a moving vehicle - yes Patient drives- yes Adequate lighting in walkways free from debris- yes Grab bars and handrails used as appropriate- yes Ambulates with an assistive device- no  Social: Alcohol intake - no      Smoking history- former Smokers in home? none Illicit drug use? none  Medication: Taking as directed and without issues.  Self managed - yes   Covid-19: Precautions and sickness symptoms discussed. Wears mask, social distancing, hand hygiene as appropriate.   Activities of Daily Living Patient denies needing assistance with: household chores, feeding themselves, getting from bed to chair, getting to the toilet, bathing/showering, dressing, managing money, or preparing meals.  Discussed the importance of a healthy diet, water intake and the benefits of aerobic exercise.   Physical activity- walking 2-4 miles daily, stretching, online exercises 25 minutes, treadmill.   Diet:  Low carb/no sugar Water: good intake  Other Providers Patient Care Team: Einar Pheasant, MD as PCP - General (Internal Medicine)  Hearing/Vision screen  Hearing Screening   125Hz  250Hz  500Hz  1000Hz  2000Hz  3000Hz  4000Hz  6000Hz  8000Hz   Right ear:           Left ear:           Comments: Patient is able to hear conversational tones without difficulty.  No issues reported.  Vision Screening Comments: Wears corrective lenses Cataract extraction, bilateral Visual acuity not assessed, virtual visit.  They have seen their ophthalmologist in the last 12 months.     Dietary issues and exercise activities discussed: Current Exercise Habits: Home exercise routine, Type of exercise: stretching;treadmill;walking, Time (Minutes): > 60, Frequency (Times/Week): 5, Weekly Exercise (Minutes/Week): 0, Intensity: Moderate  Goals      Patient Stated   . Increase brain  engagement activities (pt-stated)     I want to start playing the clarinet again.      Depression Screen PHQ 2/9 Scores 11/16/2019 08/09/2019  PHQ - 2 Score 0 0    Fall Risk Fall Risk  11/16/2019 09/02/2019 08/19/2019 08/09/2019  Falls in the past year? 0 0 0 0  Follow up Falls evaluation completed - - Falls evaluation completed   Timed Get Up and Go Performed no, virtual visit  Cognitive Function:     6CIT Screen 11/16/2019  What Year? 0 points  What month? 0 points  What time? 0 points    Screening Tests Health Maintenance  Topic Date Due  . Hepatitis C Screening  August 06, 1946  . MAMMOGRAM  12/30/1997  . COLONOSCOPY  12/30/1997  . DEXA SCAN  12/30/2012  . PNA vac Low Risk Adult (2 of 2 - PCV13) 07/11/2020  . TETANUS/TDAP  05/21/2026  . INFLUENZA VACCINE  Completed     Plan:   Keep all routine maintenance appointments.   Next scheduled fasting lab 12/23/19 @ 9:30  [facility] 12/29/19 @ 11:00  Medicare Attestation I have personally reviewed: The patient's medical and social history Their use of alcohol, tobacco or illicit drugs Their current medications and supplements The patient's functional ability including ADLs,fall risks, home safety risks, cognitive, and hearing and visual impairment Diet and physical activities Evidence for depression   I have reviewed and discussed with patient certain preventive protocols, quality metrics, and best practice recommendations.     Varney Biles, LPN   [id number]    Reviewed above information.  Agree with assessment and plan.    Dr Nicki Reaper

## 2019-11-16 NOTE — Patient Instructions (Addendum)
  Mary Everett , Thank you for taking time to come for your Medicare Wellness Visit. I appreciate your ongoing commitment to your health goals. Please review the following plan we discussed and let me know if I can assist you in the future.   These are the goals we discussed: Goals      Patient Stated   . Increase brain engagement activities (pt-stated)     I want to start playing the clarinet again.       This is a list of the screening recommended for you and due dates:  Health Maintenance  Topic Date Due  .  Hepatitis C: One time screening is recommended by Center for Disease Control  (CDC) for  adults born from [birth year] through 1963.   1945/11/28  . Mammogram  12/30/1997  . Colon Cancer Screening  12/30/1997  . DEXA scan (bone density measurement)  12/30/2012  . Pneumonia vaccines (2 of 2 - PCV13) 07/11/2020  . Tetanus Vaccine  05/21/2026  . Flu Shot  Completed

## 2019-11-26 ENCOUNTER — Encounter: Payer: Self-pay | Admitting: Internal Medicine

## 2019-12-06 DIAGNOSIS — R69 Illness, unspecified: Secondary | ICD-10-CM | POA: Diagnosis not present

## 2019-12-20 DIAGNOSIS — R69 Illness, unspecified: Secondary | ICD-10-CM | POA: Diagnosis not present

## 2019-12-23 ENCOUNTER — Other Ambulatory Visit: Payer: Self-pay | Admitting: Internal Medicine

## 2019-12-23 ENCOUNTER — Ambulatory Visit: Payer: Medicare HMO

## 2019-12-23 ENCOUNTER — Other Ambulatory Visit: Payer: Self-pay

## 2019-12-23 ENCOUNTER — Other Ambulatory Visit (INDEPENDENT_AMBULATORY_CARE_PROVIDER_SITE_OTHER): Payer: Medicare HMO

## 2019-12-23 ENCOUNTER — Ambulatory Visit: Payer: Medicare HMO | Attending: Internal Medicine

## 2019-12-23 DIAGNOSIS — Z13220 Encounter for screening for lipoid disorders: Secondary | ICD-10-CM

## 2019-12-23 DIAGNOSIS — L0231 Cutaneous abscess of buttock: Secondary | ICD-10-CM | POA: Diagnosis not present

## 2019-12-23 DIAGNOSIS — Z23 Encounter for immunization: Secondary | ICD-10-CM | POA: Insufficient documentation

## 2019-12-23 DIAGNOSIS — D72819 Decreased white blood cell count, unspecified: Secondary | ICD-10-CM

## 2019-12-23 LAB — CBC WITH DIFFERENTIAL/PLATELET
Basophils Absolute: 0.1 10*3/uL (ref 0.0–0.1)
Basophils Relative: 1.8 % (ref 0.0–3.0)
Eosinophils Absolute: 0 10*3/uL (ref 0.0–0.7)
Eosinophils Relative: 1.2 % (ref 0.0–5.0)
HCT: 41.6 % (ref 36.0–46.0)
Hemoglobin: 13.9 g/dL (ref 12.0–15.0)
Lymphocytes Relative: 49.7 % — ABNORMAL HIGH (ref 12.0–46.0)
Lymphs Abs: 1.6 10*3/uL (ref 0.7–4.0)
MCHC: 33.4 g/dL (ref 30.0–36.0)
MCV: 88.7 fl (ref 78.0–100.0)
Monocytes Absolute: 0.9 10*3/uL (ref 0.1–1.0)
Monocytes Relative: 26.7 % — ABNORMAL HIGH (ref 3.0–12.0)
Neutro Abs: 0.7 10*3/uL — ABNORMAL LOW (ref 1.4–7.7)
Neutrophils Relative %: 20.6 % — ABNORMAL LOW (ref 43.0–77.0)
Platelets: 111 10*3/uL — ABNORMAL LOW (ref 150.0–400.0)
RBC: 4.69 Mil/uL (ref 3.87–5.11)
RDW: 14.4 % (ref 11.5–15.5)
WBC: 3.2 10*3/uL — ABNORMAL LOW (ref 4.0–10.5)

## 2019-12-23 LAB — COMPREHENSIVE METABOLIC PANEL
ALT: 24 U/L (ref 0–35)
AST: 28 U/L (ref 0–37)
Albumin: 3.9 g/dL (ref 3.5–5.2)
Alkaline Phosphatase: 76 U/L (ref 39–117)
BUN: 33 mg/dL — ABNORMAL HIGH (ref 6–23)
CO2: 30 mEq/L (ref 19–32)
Calcium: 8.9 mg/dL (ref 8.4–10.5)
Chloride: 99 mEq/L (ref 96–112)
Creatinine, Ser: 0.67 mg/dL (ref 0.40–1.20)
GFR: 86.52 mL/min (ref >60.00)
Glucose, Bld: 91 mg/dL (ref 70–99)
Potassium: 4.1 mEq/L (ref 3.5–5.1)
Sodium: 136 mEq/L (ref 135–145)
Total Bilirubin: 0.4 mg/dL (ref 0.2–1.2)
Total Protein: 6.7 g/dL (ref 6.0–8.3)

## 2019-12-23 LAB — LIPID PANEL
Cholesterol: 259 mg/dL — ABNORMAL HIGH (ref 0–200)
HDL: 93.9 mg/dL (ref >39.00)
LDL Cholesterol: 156 mg/dL — ABNORMAL HIGH (ref 0–99)
NonHDL: 164.85
Total CHOL/HDL Ratio: 3
Triglycerides: 42 mg/dL (ref 0.0–149.0)
VLDL: 8.4 mg/dL (ref 0.0–40.0)

## 2019-12-23 LAB — TSH: TSH: 2.47 u[IU]/mL (ref 0.35–4.50)

## 2019-12-23 NOTE — Progress Notes (Signed)
   Covid-19 Vaccination Clinic  Name:  Mary Everett    MRN: [id number] DOB: 1946/03/08  12/23/2019  [name] was observed post Covid-19 immunization for 15 minutes without incidence. She was provided with Vaccine Information Sheet and instruction to access the V-Safe system.   [name] was instructed to call 911 with any severe reactions post vaccine: Marland Kitchen Difficulty breathing  . Swelling of your face and throat  . A fast heartbeat  . A bad rash all over your body  . Dizziness and weakness    Immunizations Administered    Name Date Dose VIS Date Route   Pfizer COVID-19 Vaccine 12/23/2019 11:04 AM 0.3 mL 10/08/2019 Intramuscular   Manufacturer: [place]   Lot: [id number]   [place]: [id number]

## 2019-12-23 NOTE — Progress Notes (Signed)
Order placed for f/u [facility].   

## 2019-12-27 ENCOUNTER — Other Ambulatory Visit: Payer: Self-pay

## 2019-12-27 ENCOUNTER — Other Ambulatory Visit (INDEPENDENT_AMBULATORY_CARE_PROVIDER_SITE_OTHER): Payer: Medicare HMO

## 2019-12-27 DIAGNOSIS — D72819 Decreased white blood cell count, unspecified: Secondary | ICD-10-CM

## 2019-12-28 LAB — CBC WITH DIFFERENTIAL/PLATELET
Basophils Absolute: 0 10*3/uL (ref 0.0–0.1)
Basophils Relative: 1 % (ref 0.0–3.0)
Eosinophils Absolute: 0 10*3/uL (ref 0.0–0.7)
Eosinophils Relative: 1 % (ref 0.0–5.0)
HCT: 38.2 % (ref 36.0–46.0)
Hemoglobin: 12.7 g/dL (ref 12.0–15.0)
Lymphocytes Relative: 45.7 % (ref 12.0–46.0)
Lymphs Abs: 1.6 10*3/uL (ref 0.7–4.0)
MCHC: 33.4 g/dL (ref 30.0–36.0)
MCV: 88.5 fl (ref 78.0–100.0)
Monocytes Absolute: 0.7 10*3/uL (ref 0.1–1.0)
Monocytes Relative: 20.9 % — ABNORMAL HIGH (ref 3.0–12.0)
Neutro Abs: 1.1 10*3/uL — ABNORMAL LOW (ref 1.4–7.7)
Neutrophils Relative %: 31.4 % — ABNORMAL LOW (ref 43.0–77.0)
Platelets: 115 10*3/uL — ABNORMAL LOW (ref 150.0–400.0)
RBC: 4.32 Mil/uL (ref 3.87–5.11)
RDW: 14.4 % (ref 11.5–15.5)
WBC: 3.5 10*3/uL — ABNORMAL LOW (ref 4.0–10.5)

## 2019-12-29 ENCOUNTER — Encounter: Payer: Medicare HMO | Admitting: Internal Medicine

## 2019-12-30 ENCOUNTER — Other Ambulatory Visit: Payer: Self-pay

## 2019-12-30 ENCOUNTER — Encounter: Payer: Self-pay | Admitting: Internal Medicine

## 2019-12-30 ENCOUNTER — Ambulatory Visit (INDEPENDENT_AMBULATORY_CARE_PROVIDER_SITE_OTHER): Payer: Medicare HMO | Admitting: Internal Medicine

## 2019-12-30 VITALS — BP 106/70 | HR 75 | Temp 97.6°F | Ht 63.0 in | Wt 108.8 lb

## 2019-12-30 DIAGNOSIS — Z0000 Encounter for general adult medical examination without abnormal findings: Secondary | ICD-10-CM

## 2019-12-30 DIAGNOSIS — Z1231 Encounter for screening mammogram for malignant neoplasm of breast: Secondary | ICD-10-CM

## 2019-12-30 DIAGNOSIS — H019 Unspecified inflammation of eyelid: Secondary | ICD-10-CM | POA: Diagnosis not present

## 2019-12-30 DIAGNOSIS — L0231 Cutaneous abscess of buttock: Secondary | ICD-10-CM

## 2019-12-30 DIAGNOSIS — D72819 Decreased white blood cell count, unspecified: Secondary | ICD-10-CM | POA: Diagnosis not present

## 2019-12-30 DIAGNOSIS — D696 Thrombocytopenia, unspecified: Secondary | ICD-10-CM

## 2019-12-30 NOTE — Progress Notes (Addendum)
Subjective:    Patient ID: Mary Everett, female    DOB: 05-30-1946, 74 y.o.   MRN: [id number]  HPI  Patient here for her physical exam. She reports she is doing relatively well.  Stays active.  No chest pain or sob reported.  No abdominal pain or bowel change reported.  Magnesium helps bowels move.  Recent abscess of buttock.  Cleared.  Does report redness - right eyelid.  Just noticed.  No redness of eye itself.  No vision change.  Discussed labs.  Discussed leukopenia and thrombocytopenia.  Agreeable to hematology referral.   Past Medical History:  Diagnosis Date  . Cataract   . Hypoglycemia   . Tubal pregnancy    Past Surgical History:  Procedure Laterality Date  . ECTOPIC PREGNANCY SURGERY     Family History  Problem Relation Age of Onset  . Cancer Mother 4       lung  . Cancer Father 64       brain  . Memory loss Sister    Social History   Socioeconomic History  . Marital status: Married    Spouse name: Not on file  . Number of children: Not on file  . Years of education: Not on file  . Highest education level: Not on file  Occupational History  . Not on file  Tobacco Use  . Smoking status: Former Smoker    Quit date: 10/17/1972    Years since quitting: 47.2  . Smokeless tobacco: Never Used  Substance and Sexual Activity  . Alcohol use: No  . Drug use: No  . Sexual activity: Not on file  Other Topics Concern  . Not on file  Social History Narrative  . Not on file   Social Determinants of Health   Financial Resource Strain: Low Risk   . Difficulty of Paying Living Expenses: Not hard at all  Food Insecurity: No Food Insecurity  . Worried About Charity fundraiser in the Last Year: Never true  . Ran Out of Food in the Last Year: Never true  Transportation Needs: No Transportation Needs  . Lack of Transportation (Medical): No  . Lack of Transportation (Non-Medical): No  Physical Activity: Sufficiently Active  . Days of Exercise per Week: 7 days  .  Minutes of Exercise per Session: 120 min  Stress: No Stress Concern Present  . Feeling of Stress : Not at all  Social Connections: Not Isolated  . Frequency of Communication with Friends and Family: More than three times a week  . Frequency of Social Gatherings with Friends and Family: More than three times a week  . Attends Religious Services: 1 to 4 times per year  . Active Member of Clubs or Organizations: Yes  . Attends Archivist Meetings: Not on file  . Marital Status: Married    Outpatient Encounter Medications as of 12/30/2019  Medication Sig  . BIOTIN PO Take by mouth.  . Cholecalciferol (VITAMIN D3) 25 MCG (1000 UT) CAPS Take by mouth.  . Magnesium Oxide 200 MG TABS magnesium  1 tablet per day  . Multiple Vitamins-Minerals (PX COMPLETE SENIOR MULTIVITS) TABS multivitamin  1 tablet per day  . Multiple Vitamins-Minerals (ZINC PO) Take by mouth.  . Omega-3 Fatty Acids (RA FISH OIL) 1000 MG CAPS Fish Oil  2 capsules per day   No facility-administered encounter medications on file as of 12/30/2019.    Review of Systems  Constitutional: Negative for appetite change and unexpected weight change.  HENT: Negative for congestion and sinus pressure.   Eyes: Negative for visual disturbance.       Redness of eyelid.    Respiratory: Negative for cough, chest tightness and shortness of breath.   Cardiovascular: Negative for chest pain, palpitations and leg swelling.  Gastrointestinal: Negative for abdominal pain, diarrhea, nausea and vomiting.  Genitourinary: Negative for difficulty urinating and dysuria.  Musculoskeletal: Negative for joint swelling and myalgias.  Skin: Negative for color change and rash.  Neurological: Negative for dizziness, light-headedness and headaches.  Hematological: Negative for adenopathy. Does not bruise/bleed easily.  Psychiatric/Behavioral: Negative for agitation and dysphoric mood.       Objective:    Physical Exam Constitutional:       General: She is not in acute distress.    Appearance: Normal appearance. She is well-developed.  HENT:     Head: Normocephalic and atraumatic.     Right Ear: External ear normal.     Left Ear: External ear normal.  Eyes:     General: No scleral icterus.       Right eye: No discharge.        Left eye: No discharge.     Conjunctiva/sclera: Conjunctivae normal.     Comments: Redness - right eyelid.   Neck:     Thyroid: No thyromegaly.  Cardiovascular:     Rate and Rhythm: Normal rate and regular rhythm.  Pulmonary:     Effort: No tachypnea, accessory muscle usage or respiratory distress.     Breath sounds: Normal breath sounds. No decreased breath sounds or wheezing.  Chest:     Breasts:        Right: No inverted nipple, mass, nipple discharge or tenderness (no axillary adenopathy).        Left: No inverted nipple, mass, nipple discharge or tenderness (no axilarry adenopathy).  Abdominal:     General: Bowel sounds are normal.     Palpations: Abdomen is soft.     Tenderness: There is no abdominal tenderness.  Musculoskeletal:        General: No swelling or tenderness.     Cervical back: Neck supple. No tenderness.  Lymphadenopathy:     Cervical: No cervical adenopathy.  Skin:    Findings: No erythema or rash.  Neurological:     Mental Status: She is alert and oriented to person, place, and time.  Psychiatric:        Mood and Affect: Mood normal.        Behavior: Behavior normal.     BP 106/70   Pulse 75   Temp 97.6 F (36.4 C) (Temporal)   Ht 5\' 3"  (1.6 m)   Wt 108 lb 12.8 oz (49.4 kg)   SpO2 96%   BMI 19.27 kg/m  Wt Readings from Last 3 Encounters:  12/30/19 108 lb 12.8 oz (49.4 kg)  11/16/19 110 lb (49.9 kg)  09/02/19 110 lb 9.6 oz (50.2 kg)     Lab Results  Component Value Date   WBC 3.5 (L) 12/27/2019   HGB 12.7 12/27/2019   HCT 38.2 12/27/2019   PLT 115.0 (L) 12/27/2019   GLUCOSE 91 12/23/2019   CHOL 259 (H) 12/23/2019   TRIG 42.0 12/23/2019   HDL  93.90 12/23/2019   LDLCALC 156 (H) 12/23/2019   ALT 24 12/23/2019   AST 28 12/23/2019   NA 136 12/23/2019   K 4.1 12/23/2019   CL 99 12/23/2019   CREATININE 0.67 12/23/2019   BUN 33 (H) 12/23/2019  CO2 30 12/23/2019   TSH 2.47 12/23/2019       Assessment & Plan:   Problem List Items Addressed This Visit    Abscess of buttock    Cleared.        Breast cancer screening    Mammogram 11/11/19 - [facility].        Eyelid dermatitis, infectious    Redness of eyelid.  Treat with warm compresses. Call with update.  Hold abx.        Healthcare maintenance    Physical today 12/30/19.  Obtain records from colonoscopy.  Mammogram 11/11/19 - [facility].        Leukopenia    Persistent low white blood cell count.  Associated with thrombocytopenia. Refer to hematology.  In reviewing, in 2016 - white blood cell count wnl.        Relevant Orders   Ambulatory referral to Hematology   Thrombocytopenia [facility])    Discussed lab results.  Platelets low.  In reviewing outside labs, platelet count 2016 133.  Given persistent low platelet count and now low wbc's, refer to hematology.        Relevant Orders   Ambulatory referral to Hematology    Other Visit Diagnoses    Routine general medical examination at a health care facility    -  Primary       Einar Pheasant, MD

## 2020-01-03 ENCOUNTER — Encounter: Payer: Self-pay | Admitting: Internal Medicine

## 2020-01-04 ENCOUNTER — Encounter: Payer: Self-pay | Admitting: Internal Medicine

## 2020-01-04 ENCOUNTER — Telehealth (INDEPENDENT_AMBULATORY_CARE_PROVIDER_SITE_OTHER): Payer: Medicare HMO | Admitting: Internal Medicine

## 2020-01-04 DIAGNOSIS — H019 Unspecified inflammation of eyelid: Secondary | ICD-10-CM

## 2020-01-04 MED ORDER — DOXYCYCLINE HYCLATE 100 MG PO TABS: 100.0000 mg | ORAL_TABLET | Freq: Two times a day (BID) | ORAL | 0 refills | Status: DC

## 2020-01-04 NOTE — Telephone Encounter (Signed)
Pt unable to see eye MD. Would like to have appt for Dr Nicki Reaper to look at. Scheduled for 4:30

## 2020-01-04 NOTE — Telephone Encounter (Signed)
Pt called and would like a call back regarding myChart message.

## 2020-01-04 NOTE — Telephone Encounter (Signed)
Patient is going to reach out to her eye doctor and let me know if problems.

## 2020-01-04 NOTE — Progress Notes (Signed)
Patient ID: Mary Everett, female   DOB: 1946/06/30, 74 y.o.   MRN: [id number]   Virtual Visit via video Note  This visit type was conducted due to national recommendations for restrictions regarding the COVID-19 pandemic (e.g. social distancing).  This format is felt to be most appropriate for this patient at this time.  All issues noted in this document were discussed and addressed.  No physical exam was performed (except for noted visual exam findings with Video Visits).   I connected with Devota Pace by a video enabled telemedicine application or telephone and verified that I am speaking with the correct person using two identifiers. Location patient: home Location provider: work  Persons participating in the virtual visit: patient, provider  The limitations, risks, security and privacy concerns of performing an evaluation and management service by telephone and the availability of in person appointments have been discussed.  The patient expressed understanding and agreed to proceed.  Reason for visit: work in appt.   HPI: Recently noticed some redness - right eyelid.  Has been using warm compresses 3-4x/day.  Recently has noticed increased redness and swelling - eyelid and out to the corner of her eye and top of her cheek.  No fever.  No headache.  Some discomfort around the eye. No vision change.  No nausea, vomiting or diarrhea.  Called her eye MD.  Was instructed - out of office and to use warm compresses.  Called with due to concerns of increased redness and swelling.     ROS: See pertinent positives and negatives per HPI.  Past Medical History:  Diagnosis Date  . Cataract   . Hypoglycemia   . Tubal pregnancy     Past Surgical History:  Procedure Laterality Date  . ECTOPIC PREGNANCY SURGERY      Family History  Problem Relation Age of Onset  . Cancer Mother 86       lung  . Cancer Father 38       brain  . Memory loss Sister     SOCIAL HX: reviewed.    Current  Outpatient Medications:  .  BIOTIN PO, Take by mouth., Disp: , Rfl:  .  Cholecalciferol (VITAMIN D3) 25 MCG (1000 UT) CAPS, Take by mouth., Disp: , Rfl:  .  doxycycline (VIBRA-TABS) 100 MG tablet, Take 1 tablet (100 mg total) by mouth 2 (two) times daily., Disp: 14 tablet, Rfl: 0 .  Magnesium Oxide 200 MG TABS, magnesium  1 tablet per day, Disp: , Rfl:  .  Multiple Vitamins-Minerals (PX COMPLETE SENIOR MULTIVITS) TABS, multivitamin  1 tablet per day, Disp: , Rfl:  .  Multiple Vitamins-Minerals (ZINC PO), Take by mouth., Disp: , Rfl:  .  Omega-3 Fatty Acids (RA FISH OIL) 1000 MG CAPS, Fish Oil  2 capsules per day, Disp: , Rfl:   EXAM:  GENERAL: alert, oriented, appears well and in no acute distress  HEENT: atraumatic, conjunttiva clear.  Increased redness/sweling upper right eyelid.  Increased redness - upper check.  no obvious abnormalities on inspection of external nose and ears  NECK: normal movements of the head and neck  LUNGS: on inspection no signs of respiratory distress, breathing rate appears normal, no obvious gross SOB, gasping or wheezing  CV: no obvious cyanosis  PSYCH/NEURO: pleasant and cooperative, no obvious depression or anxiety, speech and thought processing grossly intact  ASSESSMENT AND PLAN:  Discussed the following assessment and plan:  Eyelid dermatitis, infectious Increased redness and swelling.  Increased.  Some redness -  adjacent to eye and upper cheek.  Treat with doxycycline.  She is continuing warm compresses.  F/u with ophthalmology.  Referral placed.     Meds ordered this encounter  Medications  . doxycycline (VIBRA-TABS) 100 MG tablet    Sig: Take 1 tablet (100 mg total) by mouth 2 (two) times daily.    Dispense:  14 tablet    Refill:  0     I discussed the assessment and treatment plan with the patient. The patient was provided an opportunity to ask questions and all were answered. The patient agreed with the plan and demonstrated an  understanding of the instructions.   The patient was advised to call back or seek an in-person evaluation if the symptoms worsen or if the condition fails to improve as anticipated.    Einar Pheasant, MD

## 2020-01-05 ENCOUNTER — Encounter: Payer: Self-pay | Admitting: Internal Medicine

## 2020-01-05 DIAGNOSIS — H019 Unspecified inflammation of eyelid: Secondary | ICD-10-CM | POA: Insufficient documentation

## 2020-01-05 NOTE — Assessment & Plan Note (Signed)
Increased redness and swelling.  Increased.  Some redness - adjacent to eye and upper cheek.  Treat with doxycycline.  She is continuing warm compresses.  F/u with ophthalmology.  Referral placed.

## 2020-01-07 DIAGNOSIS — H00011 Hordeolum externum right upper eyelid: Secondary | ICD-10-CM | POA: Diagnosis not present

## 2020-01-08 DIAGNOSIS — D72819 Decreased white blood cell count, unspecified: Secondary | ICD-10-CM | POA: Insufficient documentation

## 2020-01-08 DIAGNOSIS — D696 Thrombocytopenia, unspecified: Secondary | ICD-10-CM | POA: Insufficient documentation

## 2020-01-08 DIAGNOSIS — Z0000 Encounter for general adult medical examination without abnormal findings: Secondary | ICD-10-CM | POA: Insufficient documentation

## 2020-01-08 NOTE — Assessment & Plan Note (Signed)
Physical today 12/30/19.  Obtain records from colonoscopy.  Mammogram 11/11/19 - Birads I.

## 2020-01-08 NOTE — Addendum Note (Signed)
Addended by: Alisa Graff on: 01/08/2020 03:56 PM   Modules accepted: Orders

## 2020-01-08 NOTE — Assessment & Plan Note (Signed)
Persistent low white blood cell count.  Associated with thrombocytopenia. Refer to hematology.  In reviewing, in 2016 - white blood cell count wnl.

## 2020-01-08 NOTE — Assessment & Plan Note (Signed)
Redness of eyelid.  Treat with warm compresses. Call with update.  Hold abx.

## 2020-01-08 NOTE — Assessment & Plan Note (Signed)
Cleared

## 2020-01-08 NOTE — Assessment & Plan Note (Signed)
Discussed lab results.  Platelets low.  In reviewing outside labs, platelet count 2016 133.  Given persistent low platelet count and now low wbc's, refer to hematology.

## 2020-01-08 NOTE — Assessment & Plan Note (Signed)
Mammogram 11/11/19 - Birads I.

## 2020-01-13 ENCOUNTER — Inpatient Hospital Stay: Payer: Medicare HMO

## 2020-01-13 ENCOUNTER — Encounter: Payer: Self-pay | Admitting: Internal Medicine

## 2020-01-13 ENCOUNTER — Inpatient Hospital Stay: Payer: Medicare HMO | Attending: Internal Medicine | Admitting: Internal Medicine

## 2020-01-13 DIAGNOSIS — D696 Thrombocytopenia, unspecified: Secondary | ICD-10-CM | POA: Diagnosis not present

## 2020-01-13 DIAGNOSIS — R634 Abnormal weight loss: Secondary | ICD-10-CM | POA: Insufficient documentation

## 2020-01-13 DIAGNOSIS — D709 Neutropenia, unspecified: Secondary | ICD-10-CM | POA: Diagnosis not present

## 2020-01-13 DIAGNOSIS — Z87891 Personal history of nicotine dependence: Secondary | ICD-10-CM | POA: Diagnosis not present

## 2020-01-13 DIAGNOSIS — Z79899 Other long term (current) drug therapy: Secondary | ICD-10-CM | POA: Diagnosis not present

## 2020-01-13 LAB — CBC WITH DIFFERENTIAL/PLATELET
Abs Immature Granulocytes: 0 10*3/uL (ref 0.00–0.07)
Basophils Absolute: 0 10*3/uL (ref 0.0–0.1)
Basophils Relative: 1 %
Eosinophils Absolute: 0 10*3/uL (ref 0.0–0.5)
Eosinophils Relative: 1 %
HCT: 42.4 % (ref 36.0–46.0)
Hemoglobin: 13.8 g/dL (ref 12.0–15.0)
Immature Granulocytes: 0 %
Lymphocytes Relative: 44 %
Lymphs Abs: 1.7 10*3/uL (ref 0.7–4.0)
MCH: 28.9 pg (ref 26.0–34.0)
MCHC: 32.5 g/dL (ref 30.0–36.0)
MCV: 88.7 fL (ref 80.0–100.0)
Monocytes Absolute: 0.9 10*3/uL (ref 0.1–1.0)
Monocytes Relative: 24 %
Neutro Abs: 1.1 10*3/uL — ABNORMAL LOW (ref 1.7–7.7)
Neutrophils Relative %: 30 %
Platelets: 144 10*3/uL — ABNORMAL LOW (ref 150–400)
RBC: 4.78 MIL/uL (ref 3.87–5.11)
RDW: 14.5 % (ref 11.5–15.5)
WBC: 3.7 10*3/uL — ABNORMAL LOW (ref 4.0–10.5)
nRBC: 0 % (ref 0.0–0.2)

## 2020-01-13 LAB — VITAMIN B12: Vitamin B-12: 885 pg/mL (ref 180–914)

## 2020-01-13 LAB — LACTATE DEHYDROGENASE: LDH: 143 U/L (ref 98–192)

## 2020-01-13 LAB — FOLATE: Folate: 31 ng/mL (ref >5.9)

## 2020-01-13 LAB — HEPATITIS C ANTIBODY: HCV Ab: NONREACTIVE

## 2020-01-13 LAB — HEPATITIS B CORE ANTIBODY, IGM: Hep B C IgM: NONREACTIVE

## 2020-01-13 LAB — HIV ANTIBODY (ROUTINE TESTING W REFLEX): HIV Screen 4th Generation wRfx: NONREACTIVE

## 2020-01-13 LAB — HEPATITIS B SURFACE ANTIGEN: Hepatitis B Surface Ag: NONREACTIVE

## 2020-01-13 NOTE — Progress Notes (Signed)
[facility] CONSULT NOTE  Patient Care Team: Einar Pheasant, MD as PCP - General (Internal Medicine)  CHIEF COMPLAINTS/PURPOSE OF CONSULTATION: Thrombocytopenia/neutropenia  HEMATOLOGY HISTORY  # THROMBOCYTOPENIA/ MILD LEUCOPENIA [march 2021platelets >100,000; ANC- 1.1; N-Hb]. [place] records]  # Sinus infection [2019]; buttock abscess [Dr.Canon]; right eye stye [March 2020]-  HISTORY OF PRESENTING ILLNESS:  Mary Everett 74 y.o.  female pleasant patient was been referred to [place] for further evaluation of thrombocytopenia/Leucopenia.  Patient denies any gum bleeding nosebleeding or easy bruising.   Patient of note had a buttock infection that needed drainage-in October 2020.  Patient also is currently on antibiotics for a right eyelid infection/stye.  Patient had episode of sinus infection 2019.   Patient denies any fevers or chills.  Denies any nausea vomiting headache.  Admits to weight loss unintentional-10 pounds in the last 6 months.    Review of Systems  Constitutional: Positive for weight loss. Negative for chills, diaphoresis, fever and malaise/fatigue.  HENT: Negative for nosebleeds and sore throat.   Eyes: Negative for double vision.  Respiratory: Negative for cough, hemoptysis, sputum production, shortness of breath and wheezing.   Cardiovascular: Negative for chest pain, palpitations, orthopnea and leg swelling.  Gastrointestinal: Negative for abdominal pain, blood in stool, constipation, diarrhea, heartburn, melena, nausea and vomiting.  Genitourinary: Negative for dysuria, frequency and urgency.  Musculoskeletal: Negative for back pain and joint pain.  Skin: Negative.  Negative for itching and rash.  Neurological: Negative for dizziness, tingling, focal weakness, weakness and headaches.  Endo/Heme/Allergies: Does not bruise/bleed easily.  Psychiatric/Behavioral: Negative for depression. The patient is not nervous/anxious and does not have insomnia.       MEDICAL HISTORY:  Past Medical History:  Diagnosis Date  . Cataract   . Hypoglycemia   . Tubal pregnancy     SURGICAL HISTORY: Past Surgical History:  Procedure Laterality Date  . ECTOPIC PREGNANCY SURGERY      SOCIAL HISTORY: Social History   Socioeconomic History  . Marital status: Married    Spouse name: Not on file  . Number of children: Not on file  . Years of education: Not on file  . Highest education level: Not on file  Occupational History  . Not on file  Tobacco Use  . Smoking status: Former Smoker    Quit date: 10/17/1972    Years since quitting: 47.2  . Smokeless tobacco: Never Used  Substance and Sexual Activity  . Alcohol use: No  . Drug use: No  . Sexual activity: Not on file  Other Topics Concern  . Not on file  Social History Narrative   In [place]; with husband; 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    Social Determinants of Health   Financial Resource Strain: Low Risk   . Difficulty of Paying Living Expenses: Not hard at all  Food Insecurity: No Food Insecurity  . Worried About Charity fundraiser in the Last Year: Never true  . Ran Out of Food in the Last Year: Never true  Transportation Needs: No Transportation Needs  . Lack of Transportation (Medical): No  . Lack of Transportation (Non-Medical): No  Physical Activity: Sufficiently Active  . Days of Exercise per Week: 7 days  . Minutes of Exercise per Session: 120 min  Stress: No Stress Concern Present  . Feeling of Stress : Not at all  Social Connections: Not Isolated  . Frequency of Communication with Friends and Family: More than three  times a week  . Frequency of Social Gatherings with Friends and Family: More than three times a week  . Attends Religious Services: 1 to 4 times per year  . Active Member of Clubs or Organizations: Yes  . Attends Archivist Meetings: Not on file  . Marital Status: Married  Arboriculturist Violence: Not At Risk  . Fear of Current or Ex-Partner: No  . Emotionally Abused: No  . Physically Abused: No  . Sexually Abused: No    FAMILY HISTORY: Family History  Problem Relation Age of Onset  . Cancer Mother 17       lung  . Cancer Father 33       brain  . Memory loss Sister     ALLERGIES:  is allergic to sulfa antibiotics.  MEDICATIONS:  Current Outpatient Medications  Medication Sig Dispense Refill  . BIOTIN PO Take by mouth.    . Cholecalciferol (VITAMIN D3) 25 MCG (1000 UT) CAPS Take by mouth.    . doxycycline (VIBRA-TABS) 100 MG tablet Take 1 tablet (100 mg total) by mouth 2 (two) times daily. 14 tablet 0  . Magnesium Oxide 200 MG TABS magnesium  1 tablet per day    . Multiple Vitamins-Minerals (PX COMPLETE SENIOR MULTIVITS) TABS multivitamin  1 tablet per day    . Multiple Vitamins-Minerals (ZINC PO) Take by mouth.    . Omega-3 Fatty Acids (RA FISH OIL) 1000 MG CAPS Fish Oil  2 capsules per day     No current facility-administered medications for this visit.     Marland Kitchen  PHYSICAL EXAMINATION:   Vitals:   01/13/20 1119  BP: (!) 91/43  Pulse: 62   Filed Weights   01/13/20 1119  Weight: 108 lb (49 kg)    Physical Exam  Constitutional: She is oriented to person, place, and time and well-developed, well-nourished, and in no distress.  HENT:  Head: Normocephalic and atraumatic.  Mouth/Throat: Oropharynx is clear and moist. No oropharyngeal exudate.  Eyes: Pupils are equal, round, and reactive to light.  Cardiovascular: Normal rate and regular rhythm.  Pulmonary/Chest: Effort normal and breath sounds normal. No respiratory distress. She has no wheezes.  Abdominal: Soft. Bowel sounds are normal. She exhibits no distension and no mass. There is no abdominal tenderness. There is no rebound and no guarding.  Musculoskeletal:        General: No tenderness or edema. Normal range of motion.     Cervical back: Normal range of motion and neck supple.   Neurological: She is alert and oriented to person, place, and time.  Skin: Skin is warm.  Psychiatric: Affect normal.     LABORATORY DATA:  I have reviewed the data as listed Lab Results  Component Value Date   WBC 3.7 (L) 01/13/2020   HGB 13.8 01/13/2020   HCT 42.4 01/13/2020   MCV 88.7 01/13/2020   PLT 144 (L) 01/13/2020   Recent Labs    12/23/19 0809  NA 136  K 4.1  CL 99  CO2 30  GLUCOSE 91  BUN 33*  CREATININE 0.67  CALCIUM 8.9  PROT 6.7  ALBUMIN 3.9  AST 28  ALT 24  ALKPHOS 76  BILITOT 0.4     No results found.  ASSESSMENT & PLAN:   Thrombocytopenia ([place]) #Mild thrombocytopenia-> 100,000; normal hemoglobin; absolute neutrophil count 1.1.  Patient is asymptomatic from thrombocytopenia; however question repeat infections from [facility] being low.  Requested to bring the records from [place].  # Had  a long discussion with the patient  regarding the multiple causes of thrombocytopenia/  including but not limited to medications liver disease/splenomegaly; viral infections; immune related thrombocytopenia/neutropenia; less likely malignant causes.  Discussed that if no obvious etiology noted then would recommend bone marrow biopsy for further evaluation.  Would also recommend ultrasound of the abdomen.    Recommend checking CBC;CMP WITH LDH; Check peripheral smear. Hepatitis workup. O28 and folic acid. Check ultrasound of the abdomen.   Thank you Dr.Scott for allowing me to participate in the care of your pleasant patient. Please do not hesitate to contact me with questions or concerns in the interim.  # DISPOSITION: # labs today # [place] in 1 week # follow up in 1-2 days after the [place] is done-Dr.B     All questions were answered. The patient knows to call the clinic with any problems, questions or concerns.  Thank you Dr. for allowing me to participate in the care of your pleasant patient. Please do not hesitate to contact me with questions or concerns in the  interim.   Cammie Sickle, MD 01/13/2020 1:25 PM   # 45 minutes face-to-face with the patient discussing the above plan of care; more than 50% of time spent on counseling and coordination.

## 2020-01-13 NOTE — Assessment & Plan Note (Addendum)
#  Mild thrombocytopenia-> 100,000; normal hemoglobin; absolute neutrophil count 1.1.  Patient is asymptomatic from thrombocytopenia; however question repeat infections from [facility] being low.  Requested to bring the records from [place].  # Had a long discussion with the patient  regarding the multiple causes of thrombocytopenia/  including but not limited to medications liver disease/splenomegaly; viral infections; immune related thrombocytopenia/neutropenia; less likely malignant causes.  Discussed that if no obvious etiology noted then would recommend bone marrow biopsy for further evaluation.  Would also recommend ultrasound of the abdomen.    Recommend checking CBC;CMP WITH LDH; Check peripheral smear. Hepatitis workup. K15 and folic acid. Check ultrasound of the abdomen.   Thank you Dr.Scott for allowing me to participate in the care of your pleasant patient. Please do not hesitate to contact me with questions or concerns in the interim.  # DISPOSITION: # labs today # [place] in 1 week # follow up in 1-2 days after the [place] is done-Dr.B

## 2020-01-19 ENCOUNTER — Ambulatory Visit: Payer: Medicare HMO | Attending: Internal Medicine

## 2020-01-19 DIAGNOSIS — Z23 Encounter for immunization: Secondary | ICD-10-CM

## 2020-01-19 NOTE — Progress Notes (Signed)
   Covid-19 Vaccination Clinic  Name:  Mary Everett    MRN: [id number] DOB: 09/22/1946  01/19/2020  [name] was observed post Covid-19 immunization for 15 minutes without incident. She was provided with Vaccine Information Sheet and instruction to access the V-Safe system.   [name] was instructed to call 911 with any severe reactions post vaccine: Marland Kitchen Difficulty breathing  . Swelling of face and throat  . A fast heartbeat  . A bad rash all over body  . Dizziness and weakness   Immunizations Administered    Name Date Dose VIS Date Route   Pfizer COVID-19 Vaccine 01/19/2020  9:00 AM 0.3 mL 10/08/2019 Intramuscular   Manufacturer: [employer], [employer]   Lot: [id number]   [place]: [id number]

## 2020-01-20 ENCOUNTER — Other Ambulatory Visit: Payer: Self-pay

## 2020-01-20 ENCOUNTER — Ambulatory Visit
Admission: RE | Admit: 2020-01-20 | Discharge: 2020-01-20 | Disposition: A | Discharge status: Home / Self Care | Payer: Medicare HMO | Source: Ambulatory Visit | Attending: Internal Medicine | Admitting: Internal Medicine

## 2020-01-20 DIAGNOSIS — D696 Thrombocytopenia, unspecified: Secondary | ICD-10-CM

## 2020-01-20 DIAGNOSIS — D709 Neutropenia, unspecified: Secondary | ICD-10-CM

## 2020-01-21 ENCOUNTER — Ambulatory Visit: Payer: Medicare HMO | Admitting: Internal Medicine

## 2020-01-24 ENCOUNTER — Inpatient Hospital Stay: Payer: Medicare HMO | Admitting: Internal Medicine

## 2020-01-24 DIAGNOSIS — H0011 Chalazion right upper eyelid: Secondary | ICD-10-CM | POA: Diagnosis not present

## 2020-01-27 ENCOUNTER — Ambulatory Visit
Admission: RE | Admit: 2020-01-27 | Discharge: 2020-01-27 | Disposition: A | Discharge status: Home / Self Care | Payer: Medicare HMO | Source: Ambulatory Visit | Attending: Internal Medicine | Admitting: Internal Medicine

## 2020-01-27 ENCOUNTER — Other Ambulatory Visit: Payer: Self-pay

## 2020-01-27 DIAGNOSIS — D696 Thrombocytopenia, unspecified: Secondary | ICD-10-CM | POA: Insufficient documentation

## 2020-01-27 DIAGNOSIS — D709 Neutropenia, unspecified: Secondary | ICD-10-CM | POA: Insufficient documentation

## 2020-01-28 ENCOUNTER — Other Ambulatory Visit: Payer: Self-pay

## 2020-01-28 ENCOUNTER — Encounter: Payer: Self-pay | Admitting: Internal Medicine

## 2020-01-31 ENCOUNTER — Inpatient Hospital Stay: Payer: Medicare HMO | Attending: Internal Medicine | Admitting: Internal Medicine

## 2020-01-31 ENCOUNTER — Other Ambulatory Visit: Payer: Self-pay

## 2020-01-31 VITALS — BP 104/68 | HR 68 | Temp 97.6°F | Wt 109.4 lb

## 2020-01-31 DIAGNOSIS — Z87891 Personal history of nicotine dependence: Secondary | ICD-10-CM | POA: Diagnosis not present

## 2020-01-31 DIAGNOSIS — D709 Neutropenia, unspecified: Secondary | ICD-10-CM | POA: Insufficient documentation

## 2020-01-31 DIAGNOSIS — D696 Thrombocytopenia, unspecified: Secondary | ICD-10-CM | POA: Diagnosis not present

## 2020-01-31 DIAGNOSIS — K839 Disease of biliary tract, unspecified: Secondary | ICD-10-CM | POA: Diagnosis not present

## 2020-01-31 NOTE — Assessment & Plan Note (Addendum)
#  Mild thrombocytopenia-> 100,000; normal hemoglobin.  Most recent 140; asymptomatic.  Likely ITP.  Discussed the potential signs symptoms of bleeding from low platelets.  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  I am not sure if patient's recent infections are from low ANC versus others.  Will monitor for now if continues to have frequent infections would recommend bone marrow biopsy/growth factor support.  # Right eye- chalazion- s/p antibiotics; awaiting steroid injection with ophthalmology.  # unusual distal biliary duct dilatation [incidentally on ultrasound abdomen]-recommend CT scan abdomen1 week.  December 03, 2019 bilirubin normal.  LFTs normal   # DISPOSITION: will call with results.  # CT scan Abdomen - 1 week; early AM appt.  # follow up TBD  Cc; Dr.Scott.

## 2020-01-31 NOTE — Progress Notes (Signed)
[facility] CONSULT NOTE  Patient Care Team: Einar Pheasant, MD as PCP - General (Internal Medicine)  CHIEF COMPLAINTS/PURPOSE OF CONSULTATION: Thrombocytopenia/neutropenia  HEMATOLOGY HISTORY  # THROMBOCYTOPENIA/ MILD LEUCOPENIA [march 2021platelets >100,000; ANC- 1.1; N-Hb]. [place] [from 2013-wnl; 2016- platelts-140s]  # Sinus infection [2019]; buttock abscess [Dr.Canon]; right eye chalazion [March 2020]-  # ? 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  female pleasant patient history of thrombocytopenia/leukopenia is here for follow-up/review results of the blood work/ultrasound abdomen.  Patient continues to deny any nosebleeds or gum bleeds.  Patient is status post antibiotic for her right eye infection/chalazion.  Since this not complete resolved she is awaiting steroid injections with ophthalmology.  Denies any nausea vomiting; denies abdominal pain.   Review of Systems  Constitutional: Positive for weight loss. Negative for chills, diaphoresis, fever and malaise/fatigue.  HENT: Negative for nosebleeds and sore throat.   Eyes: Negative for double vision.  Respiratory: Negative for cough, hemoptysis, sputum production, shortness of breath and wheezing.   Cardiovascular: Negative for chest pain, palpitations, orthopnea and leg swelling.  Gastrointestinal: Negative for abdominal pain, blood in stool, constipation, diarrhea, heartburn, melena, nausea and vomiting.  Genitourinary: Negative for dysuria, frequency and urgency.  Musculoskeletal: Negative for back pain and joint pain.  Skin: Negative.  Negative for itching and rash.  Neurological: Negative for dizziness, tingling, focal weakness, weakness and headaches.  Endo/Heme/Allergies: Does not bruise/bleed easily.  Psychiatric/Behavioral: Negative for depression. The patient is not nervous/anxious and does not have insomnia.      MEDICAL HISTORY:  Past Medical  History:  Diagnosis Date  . Cataract   . Hypoglycemia   . Tubal pregnancy     SURGICAL HISTORY: Past Surgical History:  Procedure Laterality Date  . ECTOPIC PREGNANCY SURGERY      SOCIAL HISTORY: Social History   Socioeconomic History  . Marital status: Married    Spouse name: Not on file  . Number of children: Not on file  . Years of education: Not on file  . Highest education level: Not on file  Occupational History  . Not on file  Tobacco Use  . Smoking status: Former Smoker    Quit date: 10/17/1972    Years since quitting: 47.3  . Smokeless tobacco: Never Used  Substance and Sexual Activity  . Alcohol use: No  . Drug use: No  . Sexual activity: Not on file  Other Topics Concern  . Not on file  Social History Narrative   In [place]; with husband; 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    Social Determinants of Health   Financial Resource Strain: Low Risk   . Difficulty of Paying Living Expenses: Not hard at all  Food Insecurity: No Food Insecurity  . Worried About Charity fundraiser in the Last Year: Never true  . Ran Out of Food in the Last Year: Never true  Transportation Needs: No Transportation Needs  . Lack of Transportation (Medical): No  . Lack of Transportation (Non-Medical): No  Physical Activity: Sufficiently Active  . Days of Exercise per Week: 7 days  . Minutes of Exercise per Session: 120 min  Stress: No Stress Concern Present  . Feeling of Stress : Not at all  Social Connections: Not Isolated  . Frequency of Communication with Friends and Family: More than three times a week  . Frequency of Social Gatherings with Friends and Family: More than three  times a week  . Attends Religious Services: 1 to 4 times per year  . Active Member of Clubs or Organizations: Yes  . Attends Archivist Meetings: Not on file  . Marital Status: Married  Human resources officer Violence: Not At Risk  . Fear of  Current or Ex-Partner: No  . Emotionally Abused: No  . Physically Abused: No  . Sexually Abused: No    FAMILY HISTORY: Family History  Problem Relation Age of Onset  . Cancer Mother 66       lung  . Cancer Father 64       brain  . Memory loss Sister     ALLERGIES:  is allergic to sulfa antibiotics.  MEDICATIONS:  Current Outpatient Medications  Medication Sig Dispense Refill  . BIOTIN PO Take by mouth.    . Cholecalciferol (VITAMIN D3) 25 MCG (1000 UT) CAPS Take by mouth.    . doxycycline (VIBRA-TABS) 100 MG tablet Take 1 tablet (100 mg total) by mouth 2 (two) times daily. 14 tablet 0  . Magnesium Oxide 200 MG TABS magnesium  1 tablet per day    . Multiple Vitamins-Minerals (PX COMPLETE SENIOR MULTIVITS) TABS multivitamin  1 tablet per day    . Omega-3 Fatty Acids (RA FISH OIL) 1000 MG CAPS Fish Oil  2 capsules per day    . Multiple Vitamins-Minerals (ZINC PO) Take by mouth.     No current facility-administered medications for this visit.     Marland Kitchen  PHYSICAL EXAMINATION:   Vitals:   01/31/20 1038  BP: 104/68  Pulse: 68  Temp: 97.6 F (36.4 C)   Filed Weights   01/31/20 1038  Weight: 109 lb 6 oz (49.6 kg)    Physical Exam  Constitutional: She is oriented to person, place, and time and well-developed, well-nourished, and in no distress.  HENT:  Head: Normocephalic and atraumatic.  Mouth/Throat: Oropharynx is clear and moist. No oropharyngeal exudate.  Eyes: Pupils are equal, round, and reactive to light.  Cardiovascular: Normal rate and regular rhythm.  Pulmonary/Chest: Effort normal and breath sounds normal. No respiratory distress. She has no wheezes.  Abdominal: Soft. Bowel sounds are normal. She exhibits no distension and no mass. There is no abdominal tenderness. There is no rebound and no guarding.  Musculoskeletal:        General: No tenderness or edema. Normal range of motion.     Cervical back: Normal range of motion and neck supple.  Neurological:  She is alert and oriented to person, place, and time.  Skin: Skin is warm.  Psychiatric: Affect normal.     LABORATORY DATA:  I have reviewed the data as listed Lab Results  Component Value Date   WBC 3.7 (L) 01/13/2020   HGB 13.8 01/13/2020   HCT 42.4 01/13/2020   MCV 88.7 01/13/2020   PLT 144 (L) 01/13/2020   Recent Labs    12/23/19 0809  NA 136  K 4.1  CL 99  CO2 30  GLUCOSE 91  BUN 33*  CREATININE 0.67  CALCIUM 8.9  PROT 6.7  ALBUMIN 3.9  AST 28  ALT 24  ALKPHOS 76  BILITOT 0.4     US Abdomen Complete  Result Date: 01/27/2020 CLINICAL DATA:  Thrombocytopenia.  Leukopenia. EXAM: ABDOMEN ULTRASOUND COMPLETE COMPARISON:  None. FINDINGS: Gallbladder: No gallstones or wall thickening visualized. No sonographic Murphy sign noted by sonographer. Common bile duct: Diameter: The distal common bile duct is dilated to a diameter of 8 mm at the  level of the pancreatic head. Just proximal to this dilatation the common bile duct is only 1.9 mm. Liver: No focal lesion identified. Within normal limits in parenchymal echogenicity. Portal vein is patent on color Doppler imaging with normal direction of blood flow towards the liver. IVC: No abnormality visualized. Pancreas: Visualized portion unremarkable. Spleen: Size and appearance within normal limits, 6.8 cm in length. Right Kidney: Length: 11.3 cm. Echogenicity within normal limits. No mass or hydronephrosis visualized. Left Kidney: Length: 10.8 cm. Echogenicity within normal limits. No mass or hydronephrosis visualized. Abdominal aorta: No aneurysm visualized. Other findings: None. IMPRESSION: Unusual dilatation of the distal common bile duct with a normal appearing proximal common bile duct. The patient's bilirubin was normal on 12/23/2019. CT scan of the abdomen with contrast recommended for further evaluation. No evidence of cirrhosis or splenomegaly. Electronically Signed   By: Lorriane Shire M.D.   On: 01/27/2020 09:00     ASSESSMENT & PLAN:   Thrombocytopenia (HCC) # Mild thrombocytopenia-> 100,000; normal hemoglobin.  Most recent 140; asymptomatic.  Likely ITP.  Discussed the potential signs symptoms of bleeding from low platelets.  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  I am not sure if patient's recent infections are from low ANC versus others.  Will monitor for now if continues to have frequent infections would recommend bone marrow biopsy/growth factor support.  # Right eye- chalazion- s/p antibiotics; awaiting steroid injection with ophthalmology.  # unusual distal biliary duct dilatation [incidentally on ultrasound abdomen]-recommend CT scan abdomen1 week.  December 03, 2019 bilirubin normal.  LFTs normal   # DISPOSITION: will call with results.  # CT scan Abdomen - 1 week; early AM appt.  # follow up TBD  Cc; Dr.Scott.      All questions were answered. The patient knows to call the clinic with any problems, questions or concerns.  Thank you Dr. for allowing me to participate in the care of your pleasant patient. Please do not hesitate to contact me with questions or concerns in the interim.   Cammie Sickle, MD 01/31/2020 1:06 PM   # 45 minutes face-to-face with the patient discussing the above plan of care; more than 50% of time spent on counseling and coordination.

## 2020-02-04 ENCOUNTER — Other Ambulatory Visit: Payer: Self-pay | Admitting: *Deleted

## 2020-02-04 ENCOUNTER — Encounter: Payer: Self-pay | Admitting: Internal Medicine

## 2020-02-04 DIAGNOSIS — D696 Thrombocytopenia, unspecified: Secondary | ICD-10-CM

## 2020-02-07 ENCOUNTER — Other Ambulatory Visit
Admission: RE | Admit: 2020-02-07 | Discharge: 2020-02-07 | Disposition: A | Discharge status: Home / Self Care | Payer: Medicare HMO | Source: Home / Self Care | Attending: Internal Medicine | Admitting: Internal Medicine

## 2020-02-07 ENCOUNTER — Encounter: Payer: Self-pay | Admitting: Internal Medicine

## 2020-02-07 ENCOUNTER — Ambulatory Visit
Admission: RE | Admit: 2020-02-07 | Discharge: 2020-02-07 | Disposition: A | Discharge status: Home / Self Care | Payer: Medicare HMO | Source: Ambulatory Visit | Attending: Internal Medicine | Admitting: Internal Medicine

## 2020-02-07 ENCOUNTER — Other Ambulatory Visit: Payer: Self-pay

## 2020-02-07 DIAGNOSIS — K838 Other specified diseases of biliary tract: Secondary | ICD-10-CM | POA: Diagnosis not present

## 2020-02-07 DIAGNOSIS — D696 Thrombocytopenia, unspecified: Secondary | ICD-10-CM

## 2020-02-07 DIAGNOSIS — K839 Disease of biliary tract, unspecified: Secondary | ICD-10-CM | POA: Diagnosis present

## 2020-02-07 LAB — CREATININE, SERUM
Creatinine, Ser: 0.52 mg/dL (ref 0.44–1.00)
GFR calc Af Amer: >60 mL/min (ref >60)
GFR calc non Af Amer: >60 mL/min (ref >60)

## 2020-02-07 MED ORDER — IOHEXOL 300 MG/ML  SOLN
75.0000 mL | Freq: Once | INTRAMUSCULAR | Status: AC | PRN
  Administered 2020-02-07: 75 mL via INTRAVENOUS

## 2020-02-08 ENCOUNTER — Telehealth: Payer: Self-pay | Admitting: Internal Medicine

## 2020-02-08 DIAGNOSIS — D696 Thrombocytopenia, unspecified: Secondary | ICD-10-CM

## 2020-02-08 DIAGNOSIS — H0011 Chalazion right upper eyelid: Secondary | ICD-10-CM | POA: Diagnosis not present

## 2020-02-08 NOTE — Telephone Encounter (Signed)
On 4/13- spoke to pt re: results of the CT scan- negative for any concerning findings. Hold off bone marrow biopsy for now. Recommend follow up 3 months  C- in 3 months- MD labs- cbc/cmp/ldh.

## 2020-02-09 NOTE — Addendum Note (Signed)
Addended by: Gloris Ham on: 02/09/2020 08:16 AM   Modules accepted: Orders

## 2020-02-29 ENCOUNTER — Encounter: Payer: Self-pay | Admitting: Internal Medicine

## 2020-02-29 NOTE — Telephone Encounter (Signed)
Patient is going to make an appt with Dr Celine Ahr. Number given for their office.

## 2020-02-29 NOTE — Telephone Encounter (Signed)
I do not mind seeing her, but she saw general surgery (Dr Celine Ahr) previous abscess.  Does she want [place] to get appt with her.  May save her two appts.  I can see her if she desires.

## 2020-03-02 ENCOUNTER — Encounter: Payer: Self-pay | Admitting: General Surgery

## 2020-03-02 ENCOUNTER — Other Ambulatory Visit: Payer: Self-pay

## 2020-03-02 ENCOUNTER — Ambulatory Visit: Payer: Medicare HMO | Admitting: General Surgery

## 2020-03-02 VITALS — BP 116/63 | HR 67 | Temp 97.8°F | Resp 12 | Ht 63.0 in | Wt 110.4 lb

## 2020-03-02 DIAGNOSIS — L0231 Cutaneous abscess of buttock: Secondary | ICD-10-CM

## 2020-03-02 MED ORDER — LEVOFLOXACIN 500 MG PO TABS: 500.0000 mg | ORAL_TABLET | Freq: Every day | ORAL | 0 refills | Status: AC

## 2020-03-02 NOTE — Progress Notes (Signed)
Patient ID: Mary Everett, female   DOB: 03/26/1946, 74 y.o.   MRN: [id number]  Chief Complaint  Patient presents with  . Follow-up    Recurrent abscess R buttock    HPI Mary Everett is a 74 y.o. female.   I have seen her twice in the past for an abscess on the inner aspect of her right buttock.  In October 2020, I performed an I&D of the area but there was no pus present.  She returned a couple of weeks later with increased inflammation.  At this point, purulent drainage was present upon incision.  Culture data from that event showed Proteus mirabilis with resistance to doxycycline, which had been previously prescribed by her PCP) as well as limited sensitivity to cephalosporins.  She had been on Keflex since the time of her I&D, but as the lesion was clearing up, I did not change her antibiotics.  She did well until about a week ago when she began noticing increased irritation in the same location.  She says it is very sore, larger than before, and quite red and irritated.  She denies any drainage from the site.  She denies any fevers or chills.  No nausea or vomiting.   Past Medical History:  Diagnosis Date  . Cataract   . Hypoglycemia   . Tubal pregnancy     Past Surgical History:  Procedure Laterality Date  . ECTOPIC PREGNANCY SURGERY      Family History  Problem Relation Age of Onset  . Cancer Mother 52       lung  . Cancer Father 29       brain  . Memory loss Sister     Social History Social History   Tobacco Use  . Smoking status: Former Smoker    Quit date: 10/17/1972    Years since quitting: 47.4  . Smokeless tobacco: Never Used  Substance Use Topics  . Alcohol use: No  . Drug use: No    Allergies  Allergen Reactions  . Sulfa Antibiotics Rash    Current Outpatient Medications  Medication Sig Dispense Refill  . BIOTIN PO Take by mouth.    . Cholecalciferol (VITAMIN D3) 25 MCG (1000 UT) CAPS Take by mouth.    . Magnesium Oxide 200 MG TABS magnesium  1  tablet per day    . Multiple Vitamins-Minerals (PX COMPLETE SENIOR MULTIVITS) TABS multivitamin  1 tablet per day    . Multiple Vitamins-Minerals (ZINC PO) Take by mouth.    . Omega-3 Fatty Acids (RA FISH OIL) 1000 MG CAPS Fish Oil  2 capsules per day    . levofloxacin (LEVAQUIN) 500 MG tablet Take 1 tablet (500 mg total) by mouth daily for 10 days. 10 tablet 0   No current facility-administered medications for this visit.    Blood pressure 116/63, pulse 67, temperature 97.8 F (36.6 C), resp. rate 12, height 5\' 3"  (1.6 m), weight 110 lb 6.4 oz (50.1 kg), SpO2 96 %.  Physical Exam Physical Exam Exam conducted with a chaperone present.  Constitutional:      General: She is not in acute distress.    Appearance: Normal appearance. She is normal weight.  HENT:     Head: Normocephalic and atraumatic.     Nose:     Comments: Covered with a mask secondary to COVID-19 precautions    Mouth/Throat:     Comments: Covered with a mask secondary to COVID-19 precautions Eyes:     General: No scleral icterus.  Right eye: No discharge.        Left eye: No discharge.     Comments: She has a chalazion on her right upper lid.  Cardiovascular:     Rate and Rhythm: Normal rate.  Pulmonary:     Effort: Pulmonary effort is normal. No respiratory distress.  Abdominal:     General: Abdomen is flat.     Palpations: Abdomen is soft.  Genitourinary:      Comments: There is an indurated and erythematous area on the medial aspect of her right buttock.  It is in the same location as on both of the prior visits.  The skin is a little bit excoriated over the surface.  It is exquisitely tender to touch. Musculoskeletal:        General: No deformity.  Skin:    General: Skin is warm and dry.  Neurological:     General: No focal deficit present.     Mental Status: She is alert and oriented to person, place, and time.  Psychiatric:        Mood and Affect: Mood normal.        Behavior: Behavior  normal.     Data Reviewed  ABSCESS RIGHT BUTTOCK     Component 6 mo ago  Anaerobic Culture Final report   Result 1 Comment   Comment: No anaerobic growth in 72 hours.  Aerobic Culture Final reportAbnormal    Result 1 Proteus mirabilisAbnormal    Comment: Heavy growth  Antimicrobial Susceptibility Comment   Comment:   ** S = Susceptible; I = Intermediate; R = Resistant **           P = Positive; N = Negative        MICS are expressed in micrograms per mL   Antibiotic         RSLT#1  RSLT#2  RSLT#3  RSLT#4  Ampicillin           R  Cefazolin           R  Cefepime            S  Ceftriaxone          S  Cefuroxime           S  Ciprofloxacin         S  Ertapenem           S  Gentamicin           S  Levofloxacin          S  Meropenem           S  Piperacillin/Tazobactam    S  Tetracycline          R  Tobramycin           S  Trimethoprim/Sulfa       S        These were the culture data from her prior incision and drainage in November.  As discussed in the history of present illness, there is resistance to tetracycline as well as some of the cephalosporins.  It is sensitive to fluoroquinolones and trimethoprim-sulfamethoxazole.  Assessment This is a 74 year old woman with recurrent abscess.  It has been drained twice before and has recurred.  She engages in extensive walking activity and I think she likely chafes the area causing minor skin breakdown which results in an entry point for bacteria.  At her prior procedure, I did not see any evidence of a  sebaceous cyst.  Plan We will repeat incision and drainage today.  Culture data will be obtained.  I will empirically place her on levofloxacin, as she has a sulfa allergy and cannot take Bactrim.  I will contact her with the results of the culture and make appropriate  adjustments to her antibiotics.  If this recurs yet again, I am not certain what else to do except perhaps completely excise the area.  I have recommended that she use antichafing stick such as that used by long distance runners to minimize the irritation to the area.  She should continue to perform sitz bath's and wash the area with soap and water daily.  I&D Location: Right buttock  Informed Consent: Discussed risks (permanent scarring, light or dark discoloration, infection, pain, bleeding, bruising, redness, damage to adjacent structures, and recurrence of the lesion) and benefits of the procedure, as well as the alternatives.  Informed consent was obtained.  Preparation: The area was prepped with chlorhexidine  Anesthesia: Lidocaine 1% with epinephrine  Procedure Details: An incision was made overlying the lesion. The lesion drained pus.  A culture was sent A small amount of fluid was drained.    A sterile dressing was applied. The patient tolerated procedure well.  Total number of lesions drained: one  Plan: The patient was instructed on post-op care. Recommend OTC analgesia as needed for pain.     Fredirick Maudlin 03/02/2020, 4:24 PM

## 2020-03-02 NOTE — Patient Instructions (Signed)
Please pick up your medication at your local pharmacy.   Dr Celine Ahr will call you with results.   Call the office if you have any questions or concerns.

## 2020-03-03 DIAGNOSIS — L0231 Cutaneous abscess of buttock: Secondary | ICD-10-CM | POA: Diagnosis not present

## 2020-04-06 ENCOUNTER — Telehealth: Payer: Self-pay | Admitting: General Surgery

## 2020-04-06 MED ORDER — LEVOFLOXACIN 500 MG PO TABS: 500.0000 mg | ORAL_TABLET | Freq: Every day | ORAL | 0 refills | Status: AC

## 2020-04-06 NOTE — Telephone Encounter (Signed)
Patient calls stating that an abscess has come back on the right side of her buttock.  She states noticed about two days ago, not real painful but uncomfortable.  I offered an appointment for her to come in but she wants someone to call her first as she is not sure maybe just antibiotics to try first.  Please call.  Thank you.

## 2020-04-06 NOTE — Telephone Encounter (Signed)
States that the abscess has come back in the same place as last time on the underside of her buttocks. She first noticed it 2 days ago and it is just a sore bump. She denies any fevers or chills. She would like to see about starting antibiotics first.

## 2020-04-06 NOTE — Telephone Encounter (Signed)
Per Dr Celine Ahr may start on antibiotic treatment. Levaquin 500 mg 1 po daily for 10 days. Patient to call us if no improvement in area. If this area continues to reoccur the patient will need to have this area taken care of in the OR.

## 2020-05-09 ENCOUNTER — Encounter: Payer: Self-pay | Admitting: Internal Medicine

## 2020-05-09 ENCOUNTER — Inpatient Hospital Stay: Payer: Medicare HMO | Attending: Internal Medicine

## 2020-05-09 ENCOUNTER — Other Ambulatory Visit: Payer: Self-pay

## 2020-05-09 ENCOUNTER — Inpatient Hospital Stay (HOSPITAL_BASED_OUTPATIENT_CLINIC_OR_DEPARTMENT_OTHER): Payer: Medicare HMO | Admitting: Internal Medicine

## 2020-05-09 DIAGNOSIS — D696 Thrombocytopenia, unspecified: Secondary | ICD-10-CM

## 2020-05-09 DIAGNOSIS — Z87891 Personal history of nicotine dependence: Secondary | ICD-10-CM | POA: Diagnosis not present

## 2020-05-09 DIAGNOSIS — D709 Neutropenia, unspecified: Secondary | ICD-10-CM | POA: Insufficient documentation

## 2020-05-09 LAB — CBC WITH DIFFERENTIAL/PLATELET
Abs Immature Granulocytes: 0.01 10*3/uL (ref 0.00–0.07)
Basophils Absolute: 0 10*3/uL (ref 0.0–0.1)
Basophils Relative: 1 %
Eosinophils Absolute: 0.1 10*3/uL (ref 0.0–0.5)
Eosinophils Relative: 2 %
HCT: 41.1 % (ref 36.0–46.0)
Hemoglobin: 13.7 g/dL (ref 12.0–15.0)
Immature Granulocytes: 0 %
Lymphocytes Relative: 39 %
Lymphs Abs: 1.7 10*3/uL (ref 0.7–4.0)
MCH: 28.7 pg (ref 26.0–34.0)
MCHC: 33.3 g/dL (ref 30.0–36.0)
MCV: 86 fL (ref 80.0–100.0)
Monocytes Absolute: 1 10*3/uL (ref 0.1–1.0)
Monocytes Relative: 24 %
Neutro Abs: 1.5 10*3/uL — ABNORMAL LOW (ref 1.7–7.7)
Neutrophils Relative %: 34 %
Platelets: 128 10*3/uL — ABNORMAL LOW (ref 150–400)
RBC: 4.78 MIL/uL (ref 3.87–5.11)
RDW: 14.4 % (ref 11.5–15.5)
WBC: 4.3 10*3/uL (ref 4.0–10.5)
nRBC: 0 % (ref 0.0–0.2)

## 2020-05-09 LAB — COMPREHENSIVE METABOLIC PANEL
ALT: 28 U/L (ref 0–44)
AST: 33 U/L (ref 15–41)
Albumin: 4 g/dL (ref 3.5–5.0)
Alkaline Phosphatase: 69 U/L (ref 38–126)
Anion gap: 8 (ref 5–15)
BUN: 28 mg/dL — ABNORMAL HIGH (ref 8–23)
CO2: 29 mmol/L (ref 22–32)
Calcium: 8.6 mg/dL — ABNORMAL LOW (ref 8.9–10.3)
Chloride: 102 mmol/L (ref 98–111)
Creatinine, Ser: 0.52 mg/dL (ref 0.44–1.00)
GFR calc Af Amer: >60 mL/min (ref >60)
GFR calc non Af Amer: >60 mL/min (ref >60)
Glucose, Bld: 114 mg/dL — ABNORMAL HIGH (ref 70–99)
Potassium: 4.4 mmol/L (ref 3.5–5.1)
Sodium: 139 mmol/L (ref 135–145)
Total Bilirubin: 0.6 mg/dL (ref 0.3–1.2)
Total Protein: 6.6 g/dL (ref 6.5–8.1)

## 2020-05-09 LAB — LACTATE DEHYDROGENASE: LDH: 145 U/L (ref 98–192)

## 2020-05-09 NOTE — Progress Notes (Signed)
[facility] CONSULT NOTE  Patient Care Team: Einar Pheasant, MD as PCP - General (Internal Medicine)  CHIEF COMPLAINTS/PURPOSE OF CONSULTATION: Thrombocytopenia/neutropenia  HEMATOLOGY HISTORY  # THROMBOCYTOPENIA/ MILD LEUCOPENIA [march 2021platelets >100,000; ANC- 1.1; N-Hb]. [place] [from 2013-wnl; 2016- platelts-140s]  # Sinus infection [2019]; buttock abscess [Dr.Canon]; right eye chalazion [March 2020]-  # ? 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  female pleasant patient history of thrombocytopenia/leukopenia is here for follow-up.  In the interim patient was evaluated by surgery had repeat I&D of the buttocks in May 2021.  Needed oral antibiotics.  Otherwise appetite is good.  No weight loss.  No nausea no vomiting.  No fevers or chills.  No nosebleed or gum bleed.   Review of Systems  Constitutional: Positive for weight loss. Negative for chills, diaphoresis, fever and malaise/fatigue.  HENT: Negative for nosebleeds and sore throat.   Eyes: Negative for double vision.  Respiratory: Negative for cough, hemoptysis, sputum production, shortness of breath and wheezing.   Cardiovascular: Negative for chest pain, palpitations, orthopnea and leg swelling.  Gastrointestinal: Negative for abdominal pain, blood in stool, constipation, diarrhea, heartburn, melena, nausea and vomiting.  Genitourinary: Negative for dysuria, frequency and urgency.  Musculoskeletal: Negative for back pain and joint pain.  Skin: Negative.  Negative for itching and rash.  Neurological: Negative for dizziness, tingling, focal weakness, weakness and headaches.  Endo/Heme/Allergies: Does not bruise/bleed easily.  Psychiatric/Behavioral: Negative for depression. The patient is not nervous/anxious and does not have insomnia.      MEDICAL HISTORY:  Past Medical History:  Diagnosis Date  . Cataract   . Hypoglycemia   . Tubal pregnancy      SURGICAL HISTORY: Past Surgical History:  Procedure Laterality Date  . ECTOPIC PREGNANCY SURGERY      SOCIAL HISTORY: Social History   Socioeconomic History  . Marital status: Married    Spouse name: Not on file  . Number of children: Not on file  . Years of education: Not on file  . Highest education level: Not on file  Occupational History  . Not on file  Tobacco Use  . Smoking status: Former Smoker    Quit date: 10/17/1972    Years since quitting: 47.5  . Smokeless tobacco: Never Used  Vaping Use  . Vaping Use: Never used  Substance and Sexual Activity  . Alcohol use: No  . Drug use: No  . Sexual activity: Not on file  Other Topics Concern  . Not on file  Social History Narrative   In [place]; with husband; 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    Social Determinants of Health   Financial Resource Strain: Low Risk   . Difficulty of Paying Living Expenses: Not hard at all  Food Insecurity: No Food Insecurity  . Worried About Charity fundraiser in the Last Year: Never true  . Ran Out of Food in the Last Year: Never true  Transportation Needs: No Transportation Needs  . Lack of Transportation (Medical): No  . Lack of Transportation (Non-Medical): No  Physical Activity: Sufficiently Active  . Days of Exercise per Week: 7 days  . Minutes of Exercise per Session: 120 min  Stress: No Stress Concern Present  . Feeling of Stress : Not at all  Social Connections: Socially Integrated  . Frequency of Communication with Friends and Family: More than three times a week  . Frequency of Social  Gatherings with Friends and Family: More than three times a week  . Attends Religious Services: 1 to 4 times per year  . Active Member of Clubs or Organizations: Yes  . Attends Archivist Meetings: Not on file  . Marital Status: Married  Human resources officer Violence: Not At Risk  . Fear of Current or Ex-Partner: No  .  Emotionally Abused: No  . Physically Abused: No  . Sexually Abused: No    FAMILY HISTORY: Family History  Problem Relation Age of Onset  . Cancer Mother 37       lung  . Cancer Father 12       brain  . Memory loss Sister     ALLERGIES:  is allergic to sulfa antibiotics.  MEDICATIONS:  Current Outpatient Medications  Medication Sig Dispense Refill  . BIOTIN PO Take by mouth.    . Cholecalciferol (VITAMIN D3) 25 MCG (1000 UT) CAPS Take by mouth.    . Magnesium Oxide 200 MG TABS magnesium  1 tablet per day    . Multiple Vitamins-Minerals (PX COMPLETE SENIOR MULTIVITS) TABS multivitamin  1 tablet per day    . Multiple Vitamins-Minerals (ZINC PO) Take by mouth.    . Omega-3 Fatty Acids (RA FISH OIL) 1000 MG CAPS Fish Oil  2 capsules per day     No current facility-administered medications for this visit.     Marland Kitchen  PHYSICAL EXAMINATION:   Vitals:   05/09/20 1105  BP: (!) 99/53  Pulse: (!) 58  Temp: (!) 97.5 F (36.4 C)  SpO2: 99%   Filed Weights   05/09/20 1105  Weight: 109 lb 9.6 oz (49.7 kg)    Physical Exam HENT:     Head: Normocephalic and atraumatic.     Mouth/Throat:     Pharynx: No oropharyngeal exudate.  Eyes:     Pupils: Pupils are equal, round, and reactive to light.  Cardiovascular:     Rate and Rhythm: Normal rate and regular rhythm.  Pulmonary:     Effort: Pulmonary effort is normal. No respiratory distress.     Breath sounds: Normal breath sounds. No wheezing.  Abdominal:     General: Bowel sounds are normal. There is no distension.     Palpations: Abdomen is soft. There is no mass.     Tenderness: There is no abdominal tenderness. There is no guarding or rebound.  Musculoskeletal:        General: No tenderness. Normal range of motion.     Cervical back: Normal range of motion and neck supple.  Skin:    General: Skin is warm.  Neurological:     Mental Status: She is alert and oriented to person, place, and time.  Psychiatric:        Mood  and Affect: Affect normal.      LABORATORY DATA:  I have reviewed the data as listed Lab Results  Component Value Date   WBC 4.3 05/09/2020   HGB 13.7 05/09/2020   HCT 41.1 05/09/2020   MCV 86.0 05/09/2020   PLT 128 (L) 05/09/2020   Recent Labs    12/23/19 0809 02/07/20 0955 05/09/20 1019  NA 136  --  139  K 4.1  --  4.4  CL 99  --  102  CO2 30  --  29  GLUCOSE 91  --  114*  BUN 33*  --  28*  CREATININE 0.67 0.52 0.52  CALCIUM 8.9  --  8.6*  GFRNONAA  --  >  60 >60  GFRAA  --  >60 >60  PROT 6.7  --  6.6  ALBUMIN 3.9  --  4.0  AST 28  --  33  ALT 24  --  28  ALKPHOS 76  --  69  BILITOT 0.4  --  0.6     No results found.  ASSESSMENT & PLAN:   Thrombocytopenia ([place]) # Mild thrombocytopenia-> 100,000; normal hemoglobin.  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  Asymptomatic monitor for now.  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 Likley etiology of patient's frequent buttock abscesses.  Monitor for now if patient continues to have repeat infections would recommend growth factor support/low-dose of steroids.  # unusual distal biliary duct dilatation [incidentally on ultrasound abdomen]-CT scan April 2021-no evidence of biliary dilatation.  # DISPOSITION:  # Follow up in 4 months-MD;labs- cbc/cmp-Dr.B  Cc; Dr.Scott.       Cammie Sickle, MD 05/09/2020 1:00 PM

## 2020-05-09 NOTE — Assessment & Plan Note (Addendum)
#   Mild thrombocytopenia-> 100,000; normal hemoglobin.  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  Asymptomatic monitor for now.  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 Likley etiology of patient's frequent buttock abscesses.  Monitor for now if patient continues to have repeat infections would recommend growth factor support/low-dose of steroids.  # unusual distal biliary duct dilatation [incidentally on ultrasound abdomen]-CT scan April 2021-no evidence of biliary dilatation.  # DISPOSITION:  # Follow up in 4 months-MD;labs- cbc/cmp-Dr.B  Cc; Dr.Scott.   # I reviewed the blood work- with the patient in detail; also reviewed the imaging independently [as summarized above]; and with the patient in detail.

## 2020-05-31 DIAGNOSIS — M79645 Pain in left finger(s): Secondary | ICD-10-CM | POA: Diagnosis not present

## 2020-05-31 DIAGNOSIS — Z882 Allergy status to sulfonamides status: Secondary | ICD-10-CM | POA: Diagnosis not present

## 2020-05-31 DIAGNOSIS — Z87891 Personal history of nicotine dependence: Secondary | ICD-10-CM | POA: Diagnosis not present

## 2020-05-31 DIAGNOSIS — W260XXA Contact with knife, initial encounter: Secondary | ICD-10-CM | POA: Diagnosis not present

## 2020-05-31 DIAGNOSIS — S61313A Laceration without foreign body of left middle finger with damage to nail, initial encounter: Secondary | ICD-10-CM | POA: Diagnosis not present

## 2020-06-27 ENCOUNTER — Ambulatory Visit
Admission: RE | Admit: 2020-06-27 | Discharge: 2020-06-27 | Disposition: A | Discharge status: Home / Self Care | Payer: Medicare HMO | Source: Ambulatory Visit | Attending: Emergency Medicine | Admitting: Emergency Medicine

## 2020-06-27 ENCOUNTER — Other Ambulatory Visit: Payer: Self-pay

## 2020-06-27 ENCOUNTER — Ambulatory Visit (INDEPENDENT_AMBULATORY_CARE_PROVIDER_SITE_OTHER): Payer: Medicare HMO

## 2020-06-27 VITALS — BP 103/48 | HR 70 | Temp 98.3°F | Resp 18 | Ht 63.0 in | Wt 110.0 lb

## 2020-06-27 DIAGNOSIS — M25531 Pain in right wrist: Secondary | ICD-10-CM | POA: Diagnosis not present

## 2020-06-27 DIAGNOSIS — S52611A Displaced fracture of right ulna styloid process, initial encounter for closed fracture: Secondary | ICD-10-CM

## 2020-06-27 DIAGNOSIS — S6991XA Unspecified injury of right wrist, hand and finger(s), initial encounter: Secondary | ICD-10-CM

## 2020-06-27 NOTE — ED Provider Notes (Signed)
HPI  SUBJECTIVE:  Mary Everett is a right-handed 74 y.o. female who presents with ulnar wrist pain, swelling after hitting it on a counter with a great deal of force 17 days ago.  She reports instant pain.  States it is not getting better, but the bruising has resolved.  She reports limitation of motion of the wrist.  No injury to the forearm, hand.  She tried an Ace wrap and a soft brace without improvement in her symptoms.  No alleviating factors.  Symptoms are worse with supination.  Past medical history negative for chronic kidney disease, GI bleed, peptic ulcer disease.  [id number], Randell Patient, MD   Past Medical History:  Diagnosis Date  . Cataract   . Hypoglycemia   . Tubal pregnancy     Past Surgical History:  Procedure Laterality Date  . ECTOPIC PREGNANCY SURGERY      Family History  Problem Relation Age of Onset  . Cancer Mother 2       lung  . Cancer Father 67       brain  . Memory loss Sister     Social History   Tobacco Use  . Smoking status: Former Smoker    Quit date: 10/17/1972    Years since quitting: 47.7  . Smokeless tobacco: Never Used  Vaping Use  . Vaping Use: Never used  Substance Use Topics  . Alcohol use: No  . Drug use: No    No current facility-administered medications for this encounter.  Current Outpatient Medications:  .  BIOTIN PO, Take by mouth., Disp: , Rfl:  .  Magnesium Oxide 200 MG TABS, magnesium  1 tablet per day, Disp: , Rfl:  .  Multiple Vitamins-Minerals (PX COMPLETE SENIOR MULTIVITS) TABS, multivitamin  1 tablet per day, Disp: , Rfl:  .  Omega-3 Fatty Acids (RA FISH OIL) 1000 MG CAPS, Fish Oil  2 capsules per day, Disp: , Rfl:  .  Cholecalciferol (VITAMIN D3) 25 MCG (1000 UT) CAPS, Take by mouth., Disp: , Rfl:  .  Multiple Vitamins-Minerals (ZINC PO), Take by mouth., Disp: , Rfl:   Allergies  Allergen Reactions  . Sulfa Antibiotics Rash     ROS  As noted in HPI.   Physical Exam  BP (!) 103/48 (BP Location: Left  Arm)   Pulse 70   Temp 98.3 F (36.8 C) (Oral)   Resp 18   Ht 5\' 3"  (1.6 m)   Wt 49.9 kg   SpO2 97%   BMI 19.49 kg/m   Constitutional: Well developed, well nourished, no acute distress Eyes:  EOMI, conjunctiva normal bilaterally HENT: Normocephalic, atraumatic,mucus membranes moist Respiratory: Normal inspiratory effort Cardiovascular: Normal rate GI: nondistended skin: No rash, skin intact Musculoskeletal: R  distal radius NT , distal ulnar styloid tender and swollen, pain with supination and ulnar deviation.  Snuffbox NT, carpals NT  metacarpals NT, digits NT, TFCC NT.  pain  with supination, no pain with pronation, pain with  ulnar deviation. Motor intact ability to flex / extend digits of affected hand, Sensation LT to hand normal.  RP 2+.  proximal forearm NT  Neurologic: Alert & oriented x 3, no focal neuro deficits Psychiatric: Speech and behavior appropriate   ED Course   Medications - No data to display  Orders Placed This Encounter  Procedures  . DG Wrist Complete Right    Standing Status:   Standing    Number of Occurrences:   1    Order Specific Question:   Reason  for Exam (SYMPTOM  OR DIAGNOSIS REQUIRED)    Answer:   hit on cabinet 06/10/20, continued pain  . Apply Wrist brace    Standing Status:   Standing    Number of Occurrences:   1    Order Specific Question:   Laterality    Answer:   Right    No results found for this or any previous visit (from the past 24 hour(s)). DG Wrist Complete Right  Result Date: 06/27/2020 CLINICAL DATA:  Right wrist direct trauma, persistent pain EXAM: RIGHT WRIST - COMPLETE 3+ VIEW COMPARISON:  None. FINDINGS: Four view radiograph right wrist demonstrates a a transverse minimally displaced anatomically aligned fracture of the base of the ulnar styloid with moderate overlying soft tissue swelling. No other fracture identified. Normal overall alignment. Joint spaces are preserved. IMPRESSION: Minimally displaced anatomically  aligned fracture of the ulnar styloid. Electronically Signed   By: Fidela Salisbury MD   On: 06/27/2020 19:54    ED Clinical Impression  1. Closed displaced fracture of styloid process of right ulna, initial encounter      ED Assessment/Plan  Reviewed imaging independently.  Minimally displaced anatomically aligned ulnar styloid fracture.  See radiology report for full details.  Placing in a wrist splint.  Home with 400 mg of ibuprofen combined with 500 mg of Tylenol 3 or 4 times a day as needed for pain.  Follow-up with Dr. Gwyneth Revels in a week.  This may need to be casted.  Discussed  imaging, MDM, treatment plan, and plan for follow-up with patient.  patient agrees with plan.   No orders of the defined types were placed in this encounter.   *This clinic note was created using Dragon dictation software. Therefore, there may be occasional mistakes despite careful proofreading.   ?    Melynda Ripple, MD 06/30/20 [phone]

## 2020-06-27 NOTE — ED Triage Notes (Signed)
Patient in today c/o right wrist pain after hitting her wrist on a counter on 06/10/20. Patient has been wrapping or splinting her wrist. Patient applied ice in the beginning. Patient took Ibuprofen and Tylenol in the beginning of symptoms.

## 2020-06-27 NOTE — Discharge Instructions (Signed)
400 mg of ibuprofen combined with 500 mg of Tylenol 3 or 4 times a day as needed for pain.  Follow-up with Dr. Gwyneth Revels within the week.  This may need to be casted.

## 2020-06-28 ENCOUNTER — Encounter: Payer: Self-pay | Admitting: Internal Medicine

## 2020-07-04 ENCOUNTER — Telehealth (INDEPENDENT_AMBULATORY_CARE_PROVIDER_SITE_OTHER): Payer: Medicare HMO | Admitting: Internal Medicine

## 2020-07-04 ENCOUNTER — Encounter: Payer: Self-pay | Admitting: Internal Medicine

## 2020-07-04 ENCOUNTER — Other Ambulatory Visit: Payer: Self-pay

## 2020-07-04 VITALS — BP 103/59 | Ht 63.0 in | Wt 110.0 lb

## 2020-07-04 DIAGNOSIS — S52614A Nondisplaced fracture of right ulna styloid process, initial encounter for closed fracture: Secondary | ICD-10-CM | POA: Diagnosis not present

## 2020-07-04 DIAGNOSIS — S62109D Fracture of unspecified carpal bone, unspecified wrist, subsequent encounter for fracture with routine healing: Secondary | ICD-10-CM | POA: Diagnosis not present

## 2020-07-04 DIAGNOSIS — D696 Thrombocytopenia, unspecified: Secondary | ICD-10-CM

## 2020-07-04 DIAGNOSIS — D72819 Decreased white blood cell count, unspecified: Secondary | ICD-10-CM

## 2020-07-04 DIAGNOSIS — E2839 Other primary ovarian failure: Secondary | ICD-10-CM

## 2020-07-04 NOTE — Progress Notes (Signed)
Patient ID: Mary Everett, female   DOB: [birth year], 74 y.o.   MRN: [id number]   Virtual Visit via video Note  This visit type was conducted due to national recommendations for restrictions regarding the COVID-19 pandemic (e.g. social distancing).  This format is felt to be most appropriate for this patient at this time.  All issues noted in this document were discussed and addressed.  No physical exam was performed (except for noted visual exam findings with Video Visits).   I connected with Mary Everett by a video enabled telemedicine application and verified that I am speaking with the correct person using two identifiers. Location patient: home Location provider: work  Persons participating in the virtual visit: patient, provider  The limitations, risks, security and privacy concerns of performing an evaluation and management service by video and the availability of in person appointments have been discussed.  It has also been discussed with the patient that there may be a patient responsible charge related to this service. The patient expressed understanding and agreed to proceed.   Reason for visit: scheduled follow up.   HPI: She was recently evaluated in ER 06/27/20 with wrist pain and swelling s/p injury. Found to have fracture of styloid process of right ulna.  Placed in splint.  Due to f/u with ortho - Dr Peggye Ley.  Followed by hematology for thrombocytopenia - ITP.  Stable.  Recommended f/u 4 months.  Stays active.  No chest pain or sob reported.  No abdominal pain or bowel change reported.  Discussed bone density.  She is agreeable.  Had questions about covid. Neighbor with covid.     ROS: See pertinent positives and negatives per HPI.  Past Medical History:  Diagnosis Date  . Cataract   . Hypoglycemia   . Tubal pregnancy     Past Surgical History:  Procedure Laterality Date  . ECTOPIC PREGNANCY SURGERY      Family History  Problem Relation Age of Onset  . Cancer Mother 58         lung  . Cancer Father 80       brain  . Memory loss Sister     SOCIAL HX: reviewed.    Current Outpatient Medications:  .  BIOTIN PO, Take by mouth., Disp: , Rfl:  .  Cholecalciferol (VITAMIN D3) 25 MCG (1000 UT) CAPS, Take by mouth., Disp: , Rfl:  .  Magnesium Oxide 200 MG TABS, magnesium  1 tablet per day, Disp: , Rfl:  .  Multiple Vitamins-Minerals (PX COMPLETE SENIOR MULTIVITS) TABS, multivitamin  1 tablet per day, Disp: , Rfl:  .  Multiple Vitamins-Minerals (ZINC PO), Take by mouth., Disp: , Rfl:  .  Omega-3 Fatty Acids (RA FISH OIL) 1000 MG CAPS, Fish Oil  2 capsules per day, Disp: , Rfl:   EXAM:  GENERAL: alert, oriented, appears well and in no acute distress  HEENT: atraumatic, conjunttiva clear, no obvious abnormalities on inspection of external nose and ears  NECK: normal movements of the head and neck  LUNGS: on inspection no signs of respiratory distress, breathing rate appears normal, no obvious gross SOB, gasping or wheezing  CV: no obvious cyanosis  PSYCH/NEURO: pleasant and cooperative, no obvious depression or anxiety, speech and thought processing grossly intact  ASSESSMENT AND PLAN:  Discussed the following assessment and plan:  Thrombocytopenia ([facility]) 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  Stable.  Recommended f/u in 4 months.    Leukopenia Followed by hematology.   Wrist fracture Splint in place.  Planning to see ortho - Dr Peggye Ley.  Follow.  Plan for bone density.    Orders Placed This Encounter  Procedures  . DG Bone Density    Standing Status:   Future    Standing Expiration Date:   07/04/2021    Scheduling Instructions:     Prefers Mon through Thursday AM    Order Specific Question:   Reason for Exam (SYMPTOM  OR DIAGNOSIS REQUIRED)    Answer:   estrogen deficiency    Order Specific Question:   Preferred imaging location?    Answer:   [facility]     I discussed the assessment and treatment plan with the patient. The patient was  provided an opportunity to ask questions and all were answered. The patient agreed with the plan and demonstrated an understanding of the instructions.   The patient was advised to call back or seek an in-person evaluation if the symptoms worsen or if the condition fails to improve as anticipated.   Einar Pheasant, MD

## 2020-07-09 ENCOUNTER — Encounter: Payer: Self-pay | Admitting: Internal Medicine

## 2020-07-09 DIAGNOSIS — S62109A Fracture of unspecified carpal bone, unspecified wrist, initial encounter for closed fracture: Secondary | ICD-10-CM | POA: Insufficient documentation

## 2020-07-09 NOTE — Assessment & Plan Note (Signed)
Splint in place.  Planning to see ortho - Dr Peggye Ley.  Follow.  Plan for bone density.

## 2020-07-09 NOTE — Assessment & Plan Note (Signed)
Followed by hematology 

## 2020-07-09 NOTE — Assessment & Plan Note (Signed)
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  Stable.  Recommended f/u in 4 months.

## 2020-08-01 DIAGNOSIS — S52614A Nondisplaced fracture of right ulna styloid process, initial encounter for closed fracture: Secondary | ICD-10-CM | POA: Diagnosis not present

## 2020-08-14 ENCOUNTER — Ambulatory Visit: Payer: Medicare HMO

## 2020-08-16 DIAGNOSIS — R69 Illness, unspecified: Secondary | ICD-10-CM | POA: Diagnosis not present

## 2020-08-22 ENCOUNTER — Encounter: Payer: Self-pay | Admitting: Internal Medicine

## 2020-09-08 ENCOUNTER — Encounter: Payer: Self-pay | Admitting: Internal Medicine

## 2020-09-12 ENCOUNTER — Inpatient Hospital Stay (HOSPITAL_BASED_OUTPATIENT_CLINIC_OR_DEPARTMENT_OTHER): Payer: Medicare HMO | Admitting: Internal Medicine

## 2020-09-12 ENCOUNTER — Other Ambulatory Visit: Payer: Self-pay

## 2020-09-12 ENCOUNTER — Inpatient Hospital Stay: Payer: Medicare HMO | Attending: Internal Medicine

## 2020-09-12 DIAGNOSIS — Z872 Personal history of diseases of the skin and subcutaneous tissue: Secondary | ICD-10-CM | POA: Diagnosis not present

## 2020-09-12 DIAGNOSIS — Z79899 Other long term (current) drug therapy: Secondary | ICD-10-CM | POA: Diagnosis not present

## 2020-09-12 DIAGNOSIS — Z87891 Personal history of nicotine dependence: Secondary | ICD-10-CM | POA: Diagnosis not present

## 2020-09-12 DIAGNOSIS — D696 Thrombocytopenia, unspecified: Secondary | ICD-10-CM

## 2020-09-12 LAB — COMPREHENSIVE METABOLIC PANEL
ALT: 26 U/L (ref 0–44)
AST: 31 U/L (ref 15–41)
Albumin: 3.9 g/dL (ref 3.5–5.0)
Alkaline Phosphatase: 66 U/L (ref 38–126)
Anion gap: 7 (ref 5–15)
BUN: 31 mg/dL — ABNORMAL HIGH (ref 8–23)
CO2: 28 mmol/L (ref 22–32)
Calcium: 8.7 mg/dL — ABNORMAL LOW (ref 8.9–10.3)
Chloride: 103 mmol/L (ref 98–111)
Creatinine, Ser: 0.54 mg/dL (ref 0.44–1.00)
GFR, Estimated: >60 mL/min (ref >60)
Glucose, Bld: 95 mg/dL (ref 70–99)
Potassium: 4.6 mmol/L (ref 3.5–5.1)
Sodium: 138 mmol/L (ref 135–145)
Total Bilirubin: 0.7 mg/dL (ref 0.3–1.2)
Total Protein: 6.3 g/dL — ABNORMAL LOW (ref 6.5–8.1)

## 2020-09-12 LAB — CBC WITH DIFFERENTIAL/PLATELET
Basophils Absolute: 0 10*3/uL (ref 0.0–0.1)
Basophils Relative: 1 %
Eosinophils Absolute: 0 10*3/uL (ref 0.0–0.5)
Eosinophils Relative: 1 %
HCT: 41.6 % (ref 36.0–46.0)
Hemoglobin: 13.7 g/dL (ref 12.0–15.0)
Lymphocytes Relative: 32 %
Lymphs Abs: 1.3 10*3/uL (ref 0.7–4.0)
MCH: 28.5 pg (ref 26.0–34.0)
MCHC: 32.9 g/dL (ref 30.0–36.0)
MCV: 86.7 fL (ref 80.0–100.0)
Monocytes Absolute: 1.1 10*3/uL — ABNORMAL HIGH (ref 0.1–1.0)
Monocytes Relative: 26 %
Neutro Abs: 1.8 10*3/uL (ref 1.7–7.7)
Neutrophils Relative %: 40 %
Platelets: 136 10*3/uL — ABNORMAL LOW (ref 150–400)
RBC: 4.8 MIL/uL (ref 3.87–5.11)
RDW: 14.7 % (ref 11.5–15.5)
WBC: 4.2 10*3/uL (ref 4.0–10.5)
nRBC: 0 % (ref 0.0–0.2)

## 2020-09-12 NOTE — Progress Notes (Signed)
[place] CONSULT NOTE  Patient Care Team: Einar Pheasant, MD as PCP - General (Internal Medicine)  CHIEF COMPLAINTS/PURPOSE OF CONSULTATION: Thrombocytopenia/neutropenia  HEMATOLOGY HISTORY  # THROMBOCYTOPENIA/ MILD LEUCOPENIA [march 2021platelets >100,000; ANC- 1.1; N-Hb]. [place] [from 2013-wnl; 2016- platelts-140s]  # Sinus infection [2019]; buttock abscess [Dr.Canon]; right eye chalazion [March 2020]; May 2021- I&D of the buttocks; PO ABx   # ? 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  HISTORY OF PRESENTING ILLNESS:  Mary Everett 74 y.o.  female pleasant patient history of thrombocytopenia/leukopenia is here for follow-up.  Patient has not had any recent hospitalization.  Has not had any repeat infections.  Not needing antibiotics.   Denies any bleeding.  Denies any bruising.  No nausea no vomiting.  Appetite is good.  No weight loss.   Review of Systems  Constitutional: Negative for chills, diaphoresis, fever and malaise/fatigue.  HENT: Negative for nosebleeds and sore throat.   Eyes: Negative for double vision.  Respiratory: Negative for cough, hemoptysis, sputum production, shortness of breath and wheezing.   Cardiovascular: Negative for chest pain, palpitations, orthopnea and leg swelling.  Gastrointestinal: Negative for abdominal pain, blood in stool, constipation, diarrhea, heartburn, melena, nausea and vomiting.  Genitourinary: Negative for dysuria, frequency and urgency.  Musculoskeletal: Negative for back pain and joint pain.  Skin: Negative.  Negative for itching and rash.  Neurological: Negative for dizziness, tingling, focal weakness, weakness and headaches.  Endo/Heme/Allergies: Does not bruise/bleed easily.  Psychiatric/Behavioral: Negative for depression. The patient is not nervous/anxious and does not have insomnia.       MEDICAL HISTORY:  Past Medical History:  Diagnosis Date  . Cataract   . Hypoglycemia   . Tubal pregnancy     SURGICAL HISTORY: Past Surgical History:  Procedure Laterality Date  . ECTOPIC PREGNANCY SURGERY      SOCIAL HISTORY: Social History   Socioeconomic History  . Marital status: Married    Spouse name: Not on file  . Number of children: Not on file  . Years of education: Not on file  . Highest education level: Not on file  Occupational History  . Not on file  Tobacco Use  . Smoking status: Former Smoker    Quit date: 10/17/1972    Years since quitting: 47.9  . Smokeless tobacco: Never Used  Vaping Use  . Vaping Use: Never used  Substance and Sexual Activity  . Alcohol use: No  . Drug use: No  . Sexual activity: Not on file  Other Topics Concern  . Not on file  Social History Narrative   In [place]; with husband; 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    Social Determinants of Health   Financial Resource Strain: Low Risk   . Difficulty of Paying Living Expenses: Not hard at all  Food Insecurity: No Food Insecurity  . Worried About Charity fundraiser in the Last Year: Never true  . Ran Out of Food in the Last Year: Never true  Transportation Needs: No Transportation Needs  . Lack of Transportation (Medical): No  . Lack of Transportation (Non-Medical): No  Physical Activity: Sufficiently Active  . Days of Exercise per Week: 7 days  . Minutes of Exercise per Session: 120 min  Stress: No Stress Concern Present  . Feeling of Stress : Not at all  Social Connections: Socially Integrated  . Frequency of  Communication with Friends and Family: More than three times a week  . Frequency of Social Gatherings with Friends and Family: More than three times a week  . Attends Religious Services: 1 to 4 times per year  . Active Member of Clubs or Organizations: Yes  . Attends Archivist Meetings: Not on  file  . Marital Status: Married  Human resources officer Violence: Not At Risk  . Fear of Current or Ex-Partner: No  . Emotionally Abused: No  . Physically Abused: No  . Sexually Abused: No    FAMILY HISTORY: Family History  Problem Relation Age of Onset  . Cancer Mother 70       lung  . Cancer Father 47       brain  . Memory loss Sister     ALLERGIES:  is allergic to sulfa antibiotics.  MEDICATIONS:  Current Outpatient Medications  Medication Sig Dispense Refill  . BIOTIN PO Take by mouth.    . Cholecalciferol (VITAMIN D3) 25 MCG (1000 UT) CAPS Take by mouth.    . Magnesium Oxide 200 MG TABS magnesium  1 tablet per day    . Multiple Vitamins-Minerals (PX COMPLETE SENIOR MULTIVITS) TABS multivitamin  1 tablet per day    . Multiple Vitamins-Minerals (ZINC PO) Take by mouth.    . Omega-3 Fatty Acids (RA FISH OIL) 1000 MG CAPS Fish Oil  2 capsules per day     No current facility-administered medications for this visit.     Marland Kitchen  PHYSICAL EXAMINATION:   Vitals:   09/12/20 1006  BP: (!) 93/47  Pulse: 67  Resp: 20  Temp: (!) 97 F (36.1 C)  SpO2: 99%   Filed Weights   09/12/20 1006  Weight: 108 lb (49 kg)    Physical Exam HENT:     Head: Normocephalic and atraumatic.     Mouth/Throat:     Pharynx: No oropharyngeal exudate.  Eyes:     Pupils: Pupils are equal, round, and reactive to light.  Cardiovascular:     Rate and Rhythm: Normal rate and regular rhythm.  Pulmonary:     Effort: Pulmonary effort is normal. No respiratory distress.     Breath sounds: Normal breath sounds. No wheezing.  Abdominal:     General: Bowel sounds are normal. There is no distension.     Palpations: Abdomen is soft. There is no mass.     Tenderness: There is no abdominal tenderness. There is no guarding or rebound.  Musculoskeletal:        General: No tenderness. Normal range of motion.     Cervical back: Normal range of motion and neck supple.  Skin:    General: Skin is warm.   Neurological:     Mental Status: She is alert and oriented to person, place, and time.  Psychiatric:        Mood and Affect: Affect normal.      LABORATORY DATA:  I have reviewed the data as listed Lab Results  Component Value Date   WBC 4.2 09/12/2020   HGB 13.7 09/12/2020   HCT 41.6 09/12/2020   MCV 86.7 09/12/2020   PLT 136 (L) 09/12/2020   Recent Labs    12/23/19 0809 12/23/19 0809 02/07/20 0955 05/09/20 1019 09/12/20 0949  NA 136  --   --  139 138  K 4.1  --   --  4.4 4.6  CL 99  --   --  102 103  CO2 30  --   --  29 28  GLUCOSE 91  --   --  114* 95  BUN 33*  --   --  28* 31*  CREATININE 0.67   < > 0.52 0.52 0.54  CALCIUM 8.9  --   --  8.6* 8.7*  GFRNONAA  --   --  >60 >60 >60  GFRAA  --   --  >60 >60  --   PROT 6.7  --   --  6.6 6.3*  ALBUMIN 3.9  --   --  4.0 3.9  AST 28  --   --  33 31  ALT 24  --   --  28 26  ALKPHOS 76  --   --  69 66  BILITOT 0.4  --   --  0.6 0.7   < > = values in this interval not displayed.     No results found.  ASSESSMENT & PLAN:   Thrombocytopenia ([place]) # Mild thrombocytopenia-> 100,000; normal hemoglobin.  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  Asymptomatic monitor for now.  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 Likley etiology of patient's frequent buttock abscesses.  Monitor for now.   * next PCP # DISPOSITION:  # Follow up in 6 months-MD;labs- cbc/cmp-Dr.B  Cc; Dr.Scott.       Cammie Sickle, MD 09/12/2020 11:19 AM

## 2020-09-12 NOTE — Assessment & Plan Note (Addendum)
#   Mild thrombocytopenia-> 100,000; normal hemoglobin.  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  Asymptomatic monitor for now.  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 Likley etiology of patient's frequent buttock abscesses.  Monitor for now.   * next PCP # DISPOSITION:  # Follow up in 6 months-MD;labs- cbc/cmp-Dr.B  Cc; Dr.Scott.

## 2020-10-19 ENCOUNTER — Telehealth: Payer: Self-pay | Admitting: Internal Medicine

## 2020-10-19 DIAGNOSIS — Z1211 Encounter for screening for malignant neoplasm of colon: Secondary | ICD-10-CM

## 2020-10-19 NOTE — Telephone Encounter (Signed)
Please schedule her for a physical in 12/2020.  Thanks.

## 2020-10-19 NOTE — Telephone Encounter (Signed)
Pt is scheduled °

## 2020-10-25 NOTE — Telephone Encounter (Signed)
Patient called in need a referral for colonoscopy and bone density test

## 2020-10-26 NOTE — Telephone Encounter (Signed)
[facility]  [place] centered or [place]  Last colonoscopy- Dr. Langston Masker naus [facility] Phone: [phone] Fax [phone]  Called and spoke to [place]. Marchia verbalized understanding and had no further questions. She states that she does not have a preference on a GI MD and asks that it be in [place] as she stays in [place]. She asks if there is anyone that Dr. Lorin Picket would recommend her to go to. Her last colonoscopy was done at [facility] by Dr. Silvio Clayman. Phone number [phone] Fax Number [phone].

## 2020-10-26 NOTE — Telephone Encounter (Signed)
Bone density is already ordered.  Need to schedule.  Bone density tests are scheduled at [facility] - where schedule mammograms.  Regarding her colonoscopy, I can place order for referral.  Need to know if she has a preference of which GI MD she wants to see.  Per previous note, she had last colonoscopy in [place].  Do not have copy of colonoscopy.  See if anyway can get name of where she had done and send medical release to get copy.

## 2020-10-29 NOTE — Telephone Encounter (Signed)
Order placed for GI referral.  Please get copy of last colonoscopy.  See note for location.  Thanks

## 2020-10-29 NOTE — Addendum Note (Signed)
Addended by: Charm Barges on: 10/29/2020 04:22 PM   Modules accepted: Orders

## 2020-10-31 NOTE — Telephone Encounter (Signed)
Requested colonoscopy

## 2020-11-14 ENCOUNTER — Telehealth (INDEPENDENT_AMBULATORY_CARE_PROVIDER_SITE_OTHER): Payer: Self-pay | Admitting: Gastroenterology

## 2020-11-14 ENCOUNTER — Other Ambulatory Visit: Payer: Self-pay

## 2020-11-14 DIAGNOSIS — Z1211 Encounter for screening for malignant neoplasm of colon: Secondary | ICD-10-CM

## 2020-11-14 MED ORDER — NA SULFATE-K SULFATE-MG SULF 17.5-3.13-1.6 GM/177ML PO SOLN: 1.0000 | Freq: Once | ORAL | 0 refills | Status: AC

## 2020-11-14 NOTE — Progress Notes (Signed)
Gastroenterology Pre-Procedure Review  Request Date: 12/19/20 Requesting Physician: Dr. Bonna Gains  PATIENT REVIEW QUESTIONS: The patient responded to the following health history questions as indicated:    1. Are you having any GI issues? no 2. Do you have a personal history of Polyps? no 3. Do you have a family history of Colon Cancer or Polyps? no 4. Diabetes Mellitus? no 5. Joint replacements in the past 12 months?no 6. Major health problems in the past 3 months?no 7. Any artificial heart valves, MVP, or defibrillator?no    MEDICATIONS & ALLERGIES:    Patient reports the following regarding taking any anticoagulation/antiplatelet therapy:   Plavix, Coumadin, Eliquis, Xarelto, Lovenox, Pradaxa, Brilinta, or Effient? no Aspirin? no  Patient confirms/reports the following medications:  Current Outpatient Medications  Medication Sig Dispense Refill  . BIOTIN PO Take by mouth.    . Cholecalciferol (VITAMIN D3) 25 MCG (1000 UT) CAPS Take by mouth.    . Magnesium Oxide 200 MG TABS magnesium  1 tablet per day    . Multiple Vitamins-Minerals (PX COMPLETE SENIOR MULTIVITS) TABS multivitamin  1 tablet per day    . Multiple Vitamins-Minerals (ZINC PO) Take by mouth.    . Omega-3 Fatty Acids (RA FISH OIL) 1000 MG CAPS Fish Oil  2 capsules per day     No current facility-administered medications for this visit.    Patient confirms/reports the following allergies:  Allergies  Allergen Reactions  . Sulfa Antibiotics Rash    No orders of the defined types were placed in this encounter.   AUTHORIZATION INFORMATION Primary Insurance: 1D#: Group #:  Secondary Insurance: 1D#: Group #:  SCHEDULE INFORMATION: Date: 12/19/20 Time: Location:[facility]

## 2020-11-16 ENCOUNTER — Ambulatory Visit (INDEPENDENT_AMBULATORY_CARE_PROVIDER_SITE_OTHER): Payer: Medicare HMO

## 2020-11-16 VITALS — Ht 63.0 in | Wt 108.0 lb

## 2020-11-16 DIAGNOSIS — Z0000 Encounter for general adult medical examination without abnormal findings: Secondary | ICD-10-CM | POA: Diagnosis not present

## 2020-11-16 NOTE — Patient Instructions (Addendum)
Mary Everett , Thank you for taking time to come for your Medicare Wellness Visit. I appreciate your ongoing commitment to your health goals. Please review the following plan we discussed and let me know if I can assist you in the future.   These are the goals we discussed: Goals      Patient Stated   .  Increase brain engagement activities (pt-stated)      I want to start playing the clarinet again.       This is a list of the screening recommended for you and due dates:  Health Maintenance  Topic Date Due  . Pneumonia vaccines (2 of 2 - PCV13) 07/11/2020  . Mammogram  11/11/2020  . Colon Cancer Screening  02/06/2021  . COVID-19 Vaccine (4 - Booster for Pfizer series) 02/09/2021  . Tetanus Vaccine  05/21/2026  . Flu Shot  Completed  . DEXA scan (bone density measurement)  Completed  .  Hepatitis C: One time screening is recommended by Center for Disease Control  (CDC) for  adults born from [birth year] through 1963.   Completed    Immunizations Immunization History  Administered Date(s) Administered  . Influenza, High Dose Seasonal PF 08/25/2018  . Influenza,inj,Quad PF,6+ Mos 07/12/2019  . Influenza-Unspecified 08/07/2020  . PFIZER(Purple Top)SARS-COV-2 Vaccination 12/23/2019, 01/19/2020  . Pneumococcal Polysaccharide-23 07/12/2019   Keep all routine maintenance appointments.   Brain engagement exercises mailed per patient request.   Follow up 01/18/21 @ 9:30  Advanced directives: [place] Advanced Directive not yet completed. Mailed.   Conditions/risks identified: none new.   Follow up in one year for your annual wellness visit.   Preventive Care 73 Years and Older, Female Preventive care refers to lifestyle choices and visits with your health care provider that can promote health and wellness. What does preventive care include?  A yearly physical exam. This is also called an annual well check.  Dental exams once or twice a year.  Routine eye exams. Ask your health care  provider how often you should have your eyes checked.  Personal lifestyle choices, including:  Daily care of your teeth and gums.  Regular physical activity.  Eating a healthy diet.  Avoiding tobacco and drug use.  Limiting alcohol use.  Practicing safe sex.  Taking low-dose aspirin every day.  Taking vitamin and mineral supplements as recommended by your health care provider. What happens during an annual well check? The services and screenings done by your health care provider during your annual well check will depend on your age, overall health, lifestyle risk factors, and family history of disease. Counseling  Your health care provider may ask you questions about your:  Alcohol use.  Tobacco use.  Drug use.  Emotional well-being.  Home and relationship well-being.  Sexual activity.  Eating habits.  History of falls.  Memory and ability to understand (cognition).  Work and work Statistician.  Reproductive health. Screening  You may have the following tests or measurements:  Height, weight, and BMI.  Blood pressure.  Lipid and cholesterol levels. These may be checked every 5 years, or more frequently if you are over 24 years old.  Skin check.  Lung cancer screening. You may have this screening every year starting at age 17 if you have a 30-pack-year history of smoking and currently smoke or have quit within the past 15 years.  Fecal occult blood test (FOBT) of the stool. You may have this test every year starting at age 65.  Flexible sigmoidoscopy or colonoscopy.  You may have a sigmoidoscopy every 5 years or a colonoscopy every 10 years starting at age 59.  Hepatitis C blood test.  Hepatitis B blood test.  Sexually transmitted disease (STD) testing.  Diabetes screening. This is done by checking your blood sugar (glucose) after you have not eaten for a while (fasting). You may have this done every 1-3 years.  Bone density scan. This is done to  screen for osteoporosis. You may have this done starting at age 5.  Mammogram. This may be done every 1-2 years. Talk to your health care provider about how often you should have regular mammograms. Talk with your health care provider about your test results, treatment options, and if necessary, the need for more tests. Vaccines  Your health care provider may recommend certain vaccines, such as:  Influenza vaccine. This is recommended every year.  Tetanus, diphtheria, and acellular pertussis (Tdap, Td) vaccine. You may need a Td booster every 10 years.  Zoster vaccine. You may need this after age 47.  Pneumococcal 13-valent conjugate (PCV13) vaccine. One dose is recommended after age 4.  Pneumococcal polysaccharide (PPSV23) vaccine. One dose is recommended after age 82. Talk to your health care provider about which screenings and vaccines you need and how often you need them. This information is not intended to replace advice given to you by your health care provider. Make sure you discuss any questions you have with your health care provider. Document Released: 11/10/2015 Document Revised: 07/03/2016 Document Reviewed: 08/15/2015 Elsevier Interactive Patient Education  2017 [employer] Prevention in the Home Falls can cause injuries. They can happen to people of all ages. There are many things you can do to make your home safe and to help prevent falls. What can I do on the outside of my home?  Regularly fix the edges of walkways and driveways and fix any cracks.  Remove anything that might make you trip as you walk through a door, such as a raised step or threshold.  Trim any bushes or trees on the path to your home.  Use bright outdoor lighting.  Clear any walking paths of anything that might make someone trip, such as rocks or tools.  Regularly check to see if handrails are loose or broken. Make sure that both sides of any steps have handrails.  Any raised decks  and porches should have guardrails on the edges.  Have any leaves, snow, or ice cleared regularly.  Use sand or salt on walking paths during winter.  Clean up any spills in your garage right away. This includes oil or grease spills. What can I do in the bathroom?  Use night lights.  Install grab bars by the toilet and in the tub and shower. Do not use towel bars as grab bars.  Use non-skid mats or decals in the tub or shower.  If you need to sit down in the shower, use a plastic, non-slip stool.  Keep the floor dry. Clean up any water that spills on the floor as soon as it happens.  Remove soap buildup in the tub or shower regularly.  Attach bath mats securely with double-sided non-slip rug tape.  Do not have throw rugs and other things on the floor that can make you trip. What can I do in the bedroom?  Use night lights.  Make sure that you have a light by your bed that is easy to reach.  Do not use any sheets or blankets that are too big  for your bed. They should not hang down onto the floor.  Have a firm chair that has side arms. You can use this for support while you get dressed.  Do not have throw rugs and other things on the floor that can make you trip. What can I do in the kitchen?  Clean up any spills right away.  Avoid walking on wet floors.  Keep items that you use a lot in easy-to-reach places.  If you need to reach something above you, use a strong step stool that has a grab bar.  Keep electrical cords out of the way.  Do not use floor polish or wax that makes floors slippery. If you must use wax, use non-skid floor wax.  Do not have throw rugs and other things on the floor that can make you trip. What can I do with my stairs?  Do not leave any items on the stairs.  Make sure that there are handrails on both sides of the stairs and use them. Fix handrails that are broken or loose. Make sure that handrails are as long as the stairways.  Check any  carpeting to make sure that it is firmly attached to the stairs. Fix any carpet that is loose or worn.  Avoid having throw rugs at the top or bottom of the stairs. If you do have throw rugs, attach them to the floor with carpet tape.  Make sure that you have a light switch at the top of the stairs and the bottom of the stairs. If you do not have them, ask someone to add them for you. What else can I do to help prevent falls?  Wear shoes that:  Do not have high heels.  Have rubber bottoms.  Are comfortable and fit you well.  Are closed at the toe. Do not wear sandals.  If you use a stepladder:  Make sure that it is fully opened. Do not climb a closed stepladder.  Make sure that both sides of the stepladder are locked into place.  Ask someone to hold it for you, if possible.  Clearly mark and make sure that you can see:  Any grab bars or handrails.  First and last steps.  Where the edge of each step is.  Use tools that help you move around (mobility aids) if they are needed. These include:  Canes.  Walkers.  Scooters.  Crutches.  Turn on the lights when you go into a dark area. Replace any light bulbs as soon as they burn out.  Set up your furniture so you have a clear path. Avoid moving your furniture around.  If any of your floors are uneven, fix them.  If there are any pets around you, be aware of where they are.  Review your medicines with your doctor. Some medicines can make you feel dizzy. This can increase your chance of falling. Ask your doctor what other things that you can do to help prevent falls. This information is not intended to replace advice given to you by your health care provider. Make sure you discuss any questions you have with your health care provider. Document Released: 08/10/2009 Document Revised: 03/21/2016 Document Reviewed: 11/18/2014 Elsevier Interactive Patient Education  2017 [employer].

## 2020-11-16 NOTE — Progress Notes (Signed)
Subjective:   Mary Everett is a 75 y.o. female who presents for Medicare Annual (Subsequent) preventive examination.  Review of Systems    No ROS.  Medicare Wellness Virtual Visit.   Cardiac Risk Factors include: advanced age (>35men, >40 women)     Objective:    Today's Vitals   11/16/20 1135  Weight: 108 lb (49 kg)  Height: 5\' 3"  (1.6 m)   Body mass index is 19.13 kg/m.  Advanced Directives 09/12/2020 05/09/2020 01/28/2020 11/16/2019  Does Patient Have a Medical Advance Directive? No No No Yes  Type of Advance Directive - - - Living will  Does patient want to make changes to medical advance directive? - - - No - Patient declined  Would patient like information on creating a medical advance directive? No - Patient declined No - Patient declined No - Patient declined -    Current Medications (verified) Outpatient Encounter Medications as of 11/16/2020  Medication Sig  . BIOTIN PO Take by mouth.  . Cholecalciferol (VITAMIN D3) 25 MCG (1000 UT) CAPS Take by mouth.  . Magnesium Oxide 200 MG TABS magnesium  1 tablet per day  . Multiple Vitamins-Minerals (PX COMPLETE SENIOR MULTIVITS) TABS multivitamin  1 tablet per day  . Multiple Vitamins-Minerals (ZINC PO) Take by mouth.  . Omega-3 Fatty Acids (RA FISH OIL) 1000 MG CAPS Fish Oil  2 capsules per day   No facility-administered encounter medications on file as of 11/16/2020.    Allergies (verified) Sulfa antibiotics   History: Past Medical History:  Diagnosis Date  . Cataract   . Hypoglycemia   . Tubal pregnancy    Past Surgical History:  Procedure Laterality Date  . ECTOPIC PREGNANCY SURGERY     Family History  Problem Relation Age of Onset  . Cancer Mother 80       lung  . Cancer Father 4       brain  . Memory loss Sister    Social History   Socioeconomic History  . Marital status: Married    Spouse name: Not on file  . Number of children: Not on file  . Years of education: Not on file  . Highest  education level: Not on file  Occupational History  . Not on file  Tobacco Use  . Smoking status: Former Smoker    Quit date: 10/17/1972    Years since quitting: 48.1  . Smokeless tobacco: Never Used  Vaping Use  . Vaping Use: Never used  Substance and Sexual Activity  . Alcohol use: No  . Drug use: No  . Sexual activity: Not on file  Other Topics Concern  . Not on file  Social History Narrative   In [place]; with husband; worked as [employer]. Never smoker [excpt 5 years in college]; no alcohol.    Social Determinants of Health   Financial Resource Strain: Low Risk   . Difficulty of Paying Living Expenses: Not hard at all  Food Insecurity: No Food Insecurity  . Worried About Journalist, newspaper in the Last Year: Never true  . Ran Out of Food in the Last Year: Never true  Transportation Needs: No Transportation Needs  . Lack of Transportation (Medical): No  . Lack of Transportation (Non-Medical): No  Physical Activity: Sufficiently Active  . Days of Exercise per Week: 7 days  . Minutes of Exercise per Session: 120 min  Stress: No Stress Concern Present  . Feeling of Stress : Not at all  Social Connections: Socially  Integrated  . Frequency of Communication with Friends and Family: More than three times a week  . Frequency of Social Gatherings with Friends and Family: More than three times a week  . Attends Religious Services: 1 to 4 times per year  . Active Member of Clubs or Organizations: Yes  . Attends Archivist Meetings: Not on file  . Marital Status: Married    Tobacco Counseling Counseling given: Not Answered   Clinical Intake:  Pre-visit preparation completed: Yes        Diabetes: No  How often do you need to have someone help you when you read instructions, pamphlets, or other written materials from your doctor or pharmacy?: 1 - Never   Interpreter Needed?: No      Activities of Daily Living In your present state of  health, do you have any difficulty performing the following activities: 11/16/2020  Hearing? N  Vision? N  Difficulty concentrating or making decisions? N  Walking or climbing stairs? N  Dressing or bathing? N  Doing errands, shopping? N  Preparing Food and eating ? N  Using the Toilet? N  In the past six months, have you accidently leaked urine? N  Do you have problems with loss of bowel control? N  Managing your Medications? N  Managing your Finances? N  Housekeeping or managing your Housekeeping? N  Some recent data might be hidden    Patient Care Team: Einar Pheasant, MD as PCP - General (Internal Medicine)  Indicate any recent Medical Services you may have received from other than [facility] providers in the past year (date may be approximate).     Assessment:   This is a routine wellness examination for [name].  I connected with Riko today by telephone and verified that I am speaking with the correct person using two identifiers. Location patient: home Location provider: work Persons participating in the virtual visit: patient, Marine scientist.    I discussed the limitations, risks, security and privacy concerns of performing an evaluation and management service by telephone and the availability of in person appointments. The patient expressed understanding and verbally consented to this telephonic visit.    Interactive audio and video telecommunications were attempted between this provider and patient, however failed, due to patient having technical difficulties OR patient did not have access to video capability.  We continued and completed visit with audio only.  Some vital signs may be absent or patient reported.   Hearing/Vision screen  Hearing Screening   125Hz  250Hz  500Hz  1000Hz  2000Hz  3000Hz  4000Hz  6000Hz  8000Hz   Right ear:           Left ear:           Comments: Patient is able to hear conversational tones without difficulty.  No issues reported.  Vision Screening Comments:  Wears corrective lenses Cataract extraction, bilateral Visual acuity not assessed, virtual visit.  They have seen their ophthalmologist in the last 12 months.     Dietary issues and exercise activities discussed: Current Exercise Habits: Home exercise routine, Type of exercise: calisthenics;walking;stretching, Time (Minutes): 55, Frequency (Times/Week): 7, Weekly Exercise (Minutes/Week): 385, Intensity: Mild  Healthy diet; avoids sugar  Good water intake  Goals      Patient Stated   .  Increase brain engagement activities (pt-stated)      I want to start playing the clarinet again.      Depression Screen PHQ 2/9 Scores 11/16/2020 12/30/2019 11/16/2019 08/09/2019  PHQ - 2 Score 0 0 0 0  Fall Risk Fall Risk  11/16/2020 03/02/2020 12/30/2019 11/16/2019 09/02/2019  Falls in the past year? 0 0 0 0 0  Number falls in past yr: 0 - 0 - -  Injury with Fall? 0 - 0 - -  Follow up Falls evaluation completed - Falls evaluation completed Falls evaluation completed -    FALL RISK PREVENTION PERTAINING TO THE HOME: Handrails in use when climbing stairs? Yes Home free of loose throw rugs in walkways, pet beds, electrical cords, etc? Yes  Adequate lighting in your home to reduce risk of falls? Yes   ASSISTIVE DEVICES UTILIZED TO PREVENT FALLS: Use of a cane, walker or w/c? No   TIMED UP AND GO: Was the test performed? No . Virtual visit.   Cognitive Function: Patient is alert and oriented x3.  Denies difficulty focusing, making decisions, memory loss.  Enjoys reading. MMSE/6CIT deferred. Normal by direct communication/observation.     6CIT Screen 11/16/2019  What Year? 0 points  What month? 0 points  What time? 0 points    Immunizations Immunization History  Administered Date(s) Administered  . Influenza, High Dose Seasonal PF 08/25/2018  . Influenza,inj,Quad PF,6+ Mos 07/12/2019  . Influenza-Unspecified 08/07/2020  . PFIZER(Purple Top)SARS-COV-2 Vaccination 12/23/2019, 01/19/2020,  08/11/2020  . Pneumococcal Polysaccharide-23 07/12/2019  . Td 05/21/2016   Health Maintenance Health Maintenance  Topic Date Due  . PNA vac Low Risk Adult (2 of 2 - PCV13) 07/11/2020  . MAMMOGRAM  11/11/2020  . COLONOSCOPY (Pts 45-31yrs Insurance coverage will need to be confirmed)  02/06/2021  . COVID-19 Vaccine (4 - Booster for Pfizer series) 02/09/2021  . TETANUS/TDAP  05/21/2026  . INFLUENZA VACCINE  Completed  . DEXA SCAN  Completed  . Hepatitis C Screening  Completed   Colorectal cancer screening: Type of screening: Colonoscopy. Completed 02/07/11. Repeat every 10 years.  Mammogram- 11/11/19. Birads I. plans to schedule with [facility].  Bone density- plans to inquire with [facility] for scheduling same day as mammogram. Follow up with pcp as needed.   Lung Cancer Screening: (Low Dose CT Chest recommended if Age 55-80 years, 30 pack-year currently smoking OR have quit w/in 15years.) does not qualify.   Hepatitis C Screening: Completed 01/13/20.  Vision Screening: Recommended annual ophthalmology exams for early detection of glaucoma and other disorders of the eye. Is the patient up to date with their annual eye exam?  Yes  Who is the provider or what is the name of the office in which the patient attends annual eye exams? Dr. Marcellina Millin, [place].  Dental Screening: Recommended annual dental exams for proper oral hygiene. Visits every 6 months.   Community Resource Referral / Chronic Care Management: CRR required this visit?  No   CCM required this visit?  No      Plan:   Keep all routine maintenance appointments.   Follow up 01/18/21 @ 9:30  Brain engagement exercises mailed per patient request.   I have personally reviewed and noted the following in the patient's chart:   . Medical and social history . Use of alcohol, tobacco or illicit drugs  . Current medications and supplements . Functional ability and status . Nutritional  status . Physical activity . Advanced directives . List of other physicians . Hospitalizations, surgeries, and ER visits in previous 12 months . Vitals . Screenings to include cognitive, depression, and falls . Referrals and appointments  In addition, I have reviewed and discussed with patient certain preventive protocols, quality metrics, and best  practice recommendations. A written personalized care plan for preventive services as well as general preventive health recommendations were provided to patient via mychart.     Varney Biles, LPN   [id number]/41/6606

## 2020-12-04 ENCOUNTER — Encounter: Payer: Self-pay | Admitting: Internal Medicine

## 2020-12-04 DIAGNOSIS — E2839 Other primary ovarian failure: Secondary | ICD-10-CM

## 2020-12-06 ENCOUNTER — Encounter: Payer: Self-pay | Admitting: Internal Medicine

## 2020-12-08 ENCOUNTER — Other Ambulatory Visit: Payer: Medicare HMO

## 2020-12-13 ENCOUNTER — Encounter: Payer: Self-pay | Admitting: Gastroenterology

## 2020-12-13 ENCOUNTER — Other Ambulatory Visit: Payer: Self-pay

## 2020-12-15 ENCOUNTER — Other Ambulatory Visit: Payer: Self-pay

## 2020-12-15 ENCOUNTER — Other Ambulatory Visit
Admission: RE | Admit: 2020-12-15 | Discharge: 2020-12-15 | Disposition: A | Discharge status: Home / Self Care | Payer: Medicare HMO | Source: Ambulatory Visit | Attending: Gastroenterology | Admitting: Gastroenterology

## 2020-12-15 DIAGNOSIS — Z20822 Contact with and (suspected) exposure to covid-19: Secondary | ICD-10-CM | POA: Diagnosis not present

## 2020-12-15 DIAGNOSIS — Z01812 Encounter for preprocedural laboratory examination: Secondary | ICD-10-CM | POA: Diagnosis not present

## 2020-12-15 LAB — SARS CORONAVIRUS 2 (TAT 6-24 HRS): SARS Coronavirus 2: NEGATIVE

## 2020-12-18 NOTE — Discharge Instructions (Signed)

## 2020-12-19 ENCOUNTER — Encounter: Payer: Self-pay | Admitting: Gastroenterology

## 2020-12-19 ENCOUNTER — Other Ambulatory Visit: Payer: Self-pay

## 2020-12-19 ENCOUNTER — Ambulatory Visit
Admission: RE | Admit: 2020-12-19 | Discharge: 2020-12-19 | Disposition: A | Discharge status: Home / Self Care | Payer: Medicare HMO | Attending: Gastroenterology | Admitting: Gastroenterology

## 2020-12-19 ENCOUNTER — Ambulatory Visit: Payer: Medicare HMO | Admitting: Anesthesiology

## 2020-12-19 ENCOUNTER — Encounter
Admission: RE | Disposition: A | Discharge status: Home / Self Care | Payer: Self-pay | Source: Home / Self Care | Attending: Gastroenterology

## 2020-12-19 DIAGNOSIS — Z87891 Personal history of nicotine dependence: Secondary | ICD-10-CM | POA: Diagnosis not present

## 2020-12-19 DIAGNOSIS — D124 Benign neoplasm of descending colon: Secondary | ICD-10-CM | POA: Diagnosis not present

## 2020-12-19 DIAGNOSIS — K635 Polyp of colon: Secondary | ICD-10-CM

## 2020-12-19 DIAGNOSIS — Z801 Family history of malignant neoplasm of trachea, bronchus and lung: Secondary | ICD-10-CM | POA: Insufficient documentation

## 2020-12-19 DIAGNOSIS — Z808 Family history of malignant neoplasm of other organs or systems: Secondary | ICD-10-CM | POA: Diagnosis not present

## 2020-12-19 DIAGNOSIS — Z1211 Encounter for screening for malignant neoplasm of colon: Secondary | ICD-10-CM | POA: Diagnosis not present

## 2020-12-19 DIAGNOSIS — D126 Benign neoplasm of colon, unspecified: Secondary | ICD-10-CM | POA: Diagnosis not present

## 2020-12-19 DIAGNOSIS — Z79899 Other long term (current) drug therapy: Secondary | ICD-10-CM | POA: Insufficient documentation

## 2020-12-19 DIAGNOSIS — D122 Benign neoplasm of ascending colon: Secondary | ICD-10-CM | POA: Insufficient documentation

## 2020-12-19 DIAGNOSIS — Z882 Allergy status to sulfonamides status: Secondary | ICD-10-CM | POA: Insufficient documentation

## 2020-12-19 HISTORY — PX: COLONOSCOPY WITH PROPOFOL: SHX5780

## 2020-12-19 HISTORY — PX: POLYPECTOMY: SHX5525

## 2020-12-19 SURGERY — COLONOSCOPY WITH PROPOFOL
Anesthesia: General | Site: Rectum

## 2020-12-19 MED ORDER — LIDOCAINE HCL (CARDIAC) PF 100 MG/5ML IV SOSY
PREFILLED_SYRINGE | INTRAVENOUS | Status: DC | PRN
  Administered 2020-12-19: 50 mg via INTRAVENOUS

## 2020-12-19 MED ORDER — LACTATED RINGERS IV SOLN: INTRAVENOUS | Status: DC

## 2020-12-19 MED ORDER — PROPOFOL 10 MG/ML IV BOLUS
INTRAVENOUS | Status: DC | PRN
  Administered 2020-12-19: 30 mg via INTRAVENOUS
  Administered 2020-12-19: 20 mg via INTRAVENOUS
  Administered 2020-12-19: 70 mg via INTRAVENOUS
  Administered 2020-12-19 (×10): 20 mg via INTRAVENOUS

## 2020-12-19 SURGICAL SUPPLY — 26 items
CLIP HMST 235XBRD CATH ROT (MISCELLANEOUS) IMPLANT
CLIP RESOLUTION 360 11X235 (MISCELLANEOUS)
ELECT REM PT RETURN 9FT ADLT (ELECTROSURGICAL)
ELECTRODE REM PT RTRN 9FT ADLT (ELECTROSURGICAL) IMPLANT
FCP ESCP3.2XJMB 240X2.8X (MISCELLANEOUS)
FORCEPS BIOP RAD 4 LRG CAP 4 (CUTTING FORCEPS) IMPLANT
FORCEPS BIOP RJ4 240 W/NDL (MISCELLANEOUS)
FORCEPS ESCP3.2XJMB 240X2.8X (MISCELLANEOUS) IMPLANT
GOWN CVR UNV OPN BCK APRN NK (MISCELLANEOUS) ×2 IMPLANT
GOWN ISOL THUMB LOOP REG UNIV (MISCELLANEOUS) ×4
INJECTOR VARIJECT VIN23 (MISCELLANEOUS) IMPLANT
KIT DEFENDO VALVE AND CONN (KITS) IMPLANT
KIT PRC NS LF DISP ENDO (KITS) ×1 IMPLANT
KIT PROCEDURE OLYMPUS (KITS) ×2
MANIFOLD NEPTUNE II (INSTRUMENTS) ×2 IMPLANT
MARKER SPOT ENDO TATTOO 5ML (MISCELLANEOUS) IMPLANT
PROBE APC STR FIRE (PROBE) IMPLANT
RETRIEVER NET ROTH 2.5X230 LF (MISCELLANEOUS) IMPLANT
SNARE COLD EXACTO (MISCELLANEOUS) ×2 IMPLANT
SNARE SHORT THROW 13M SML OVAL (MISCELLANEOUS) IMPLANT
SNARE SHORT THROW 30M LRG OVAL (MISCELLANEOUS) IMPLANT
SNARE SNG USE RND 15MM (INSTRUMENTS) IMPLANT
SPOT EX ENDOSCOPIC TATTOO (MISCELLANEOUS)
TRAP ETRAP POLY (MISCELLANEOUS) ×2 IMPLANT
VARIJECT INJECTOR VIN23 (MISCELLANEOUS)
WATER STERILE IRR 250ML POUR (IV SOLUTION) ×2 IMPLANT

## 2020-12-19 NOTE — Anesthesia Preprocedure Evaluation (Signed)
Anesthesia Evaluation  Patient identified by MRN, date of birth, ID band Patient awake    Reviewed: Allergy & Precautions, NPO status , Patient's Chart, lab work & pertinent test results  Airway Mallampati: II  TM Distance: >3 FB Neck ROM: Full    Dental no notable dental hx.    Pulmonary neg pulmonary ROS,    Pulmonary exam normal        Cardiovascular Exercise Tolerance: Good negative cardio ROS Normal cardiovascular exam     Neuro/Psych negative psych ROS   GI/Hepatic negative GI ROS, Neg liver ROS,   Endo/Other  negative endocrine ROS  Renal/GU negative Renal ROS     Musculoskeletal negative musculoskeletal ROS (+)   Abdominal Normal abdominal exam  (+)   Peds  Hematology negative hematology ROS (+)   Anesthesia Other Findings   Reproductive/Obstetrics                             Anesthesia Physical Anesthesia Plan  ASA: I  Anesthesia Plan: General   Post-op Pain Management:    Induction: Intravenous  PONV Risk Score and Plan: 3 and TIVA, Propofol infusion and Treatment may vary due to age or medical condition  Airway Management Planned: Natural Airway  Additional Equipment: None  Intra-op Plan:   Post-operative Plan:   Informed Consent: I have reviewed the patients History and Physical, chart, labs and discussed the procedure including the risks, benefits and alternatives for the proposed anesthesia with the patient or authorized representative who has indicated his/her understanding and acceptance.     Dental advisory given  Plan Discussed with: CRNA  Anesthesia Plan Comments:         Anesthesia Quick Evaluation

## 2020-12-19 NOTE — Op Note (Signed)
[facility] Patient Name: Mary Everett Procedure Date: 12/19/2020 9:03 AM MRN: [id number] Account #: [id number] Date of Birth: [birth year] Admit Type: Outpatient Age: 75 Room: [facility] OR ROOM 01 Gender: Female Note Status: Finalized Procedure:             Colonoscopy Indications:           Screening for colorectal malignant neoplasm Providers:             Allyn Bartelson B. Bonna Gains MD, MD Referring MD:          Einar Pheasant, MD (Referring MD) Medicines:             Monitored Anesthesia Care Complications:         No immediate complications. Procedure:             Pre-Anesthesia Assessment:                        - ASA Grade Assessment: II - A patient with mild                         systemic disease.                        - Prior to the procedure, a History and Physical was                         performed, and patient medications, allergies and                         sensitivities were reviewed. The patient's tolerance                         of previous anesthesia was reviewed.                        - The risks and benefits of the procedure and the                         sedation options and risks were discussed with the                         patient. All questions were answered and informed                         consent was obtained.                        - Patient identification and proposed procedure were                         verified prior to the procedure by the physician, the                         nurse, the anesthesiologist, the anesthetist and the                         technician. The procedure was verified in the                         procedure room.  After obtaining informed consent, the colonoscope was                         passed under direct vision. Throughout the procedure,                         the patient's blood pressure, pulse, and oxygen                         saturations were monitored  continuously. The was                         introduced through the anus and advanced to the the                         cecum, identified by appendiceal orifice and ileocecal                         valve. The colonoscopy was performed with ease. The                         patient tolerated the procedure well. The quality of                         the bowel preparation was good. Findings:      The perianal and digital rectal examinations were normal.      Two sessile polyps were found in the descending colon and ascending       colon. The polyps were 5 to 6 mm in size. These polyps were removed with       a cold snare. Resection and retrieval were complete.      The exam was otherwise without abnormality.      The rectum, sigmoid colon, descending colon, transverse colon, ascending       colon and cecum appeared normal.      The retroflexed view of the distal rectum and anal verge was normal and       showed no anal or rectal abnormalities. Impression:            - Two 5 to 6 mm polyps in the descending colon and in                         the ascending colon, removed with a cold snare.                         Resected and retrieved.                        - The examination was otherwise normal.                        - The rectum, sigmoid colon, descending colon,                         transverse colon, ascending colon and cecum are normal.                        - The distal rectum and anal verge are normal on  retroflexion view. Recommendation:        - Discharge patient to home (with escort).                        - Advance diet as tolerated.                        - Continue present medications.                        - Await pathology results.                        - Repeat colonoscopy date to be determined after                         pending pathology results are reviewed.                        - The findings and recommendations were discussed  with                         the patient.                        - The findings and recommendations were discussed with                         the patient's family.                        - Return to primary care physician as previously                         scheduled. Procedure Code(s):     --- Professional ---                        [phone], Colonoscopy, flexible; with removal of                         tumor(s), polyp(s), or other lesion(s) by snare                         technique Diagnosis Code(s):     --- Professional ---                        Z12.11, Encounter for screening for malignant neoplasm                         of colon                        K63.5, Polyp of colon CPT copyright 2019 American Medical Association. All rights reserved. The codes documented in this report are preliminary and upon coder review may  be revised to meet current compliance requirements.  Vonda Antigua, MD Margretta Sidle B. Bonna Gains MD, MD 12/19/2020 10:25:43 AM This report has been signed electronically. Number of Addenda: 0 Note Initiated On: 12/19/2020 9:03 AM Scope Withdrawal Time: 0 hours 18 minutes 10 seconds  Total Procedure Duration: 0 hours 30 minutes 30 seconds  Estimated Blood Loss:  Estimated  blood loss: none.      [facility]

## 2020-12-19 NOTE — H&P (Signed)
Mary Antigua, MD [address], [place], [place], [place], [address] [address], [place], [place], [place], [address] Phone: [phone]  Fax: [phone]  Primary Care Physician:  Einar Pheasant, MD   Pre-Procedure History & Physical: HPI:  Mary Everett is a 75 y.o. female is here for a colonoscopy.   Past Medical History:  Diagnosis Date  . Cataract   . Hypoglycemia   . Tubal pregnancy     Past Surgical History:  Procedure Laterality Date  . ECTOPIC PREGNANCY SURGERY      Prior to Admission medications   Medication Sig Start Date End Date Taking? Authorizing Provider  BIOTIN PO Take by mouth.   Yes [provider]  Cholecalciferol (VITAMIN D3) 25 MCG (1000 UT) CAPS Take by mouth.   Yes [provider]  FOLIC ACID PO Take by mouth daily.   Yes [provider]  Magnesium Oxide 200 MG TABS magnesium  1 tablet per day   Yes [provider]  Multiple Vitamins-Minerals (PX COMPLETE SENIOR MULTIVITS) TABS multivitamin  1 tablet per day   Yes [provider]  Multiple Vitamins-Minerals (ZINC PO) Take by mouth.   Yes [provider]  Omega-3 Fatty Acids (RA FISH OIL) 1000 MG CAPS Fish Oil  2 capsules per day   Yes [provider]  Probiotic Product (PROBIOTIC DAILY PO) Take by mouth daily.   Yes [provider]    Allergies as of 11/14/2020 - Review Complete 11/14/2020  Allergen Reaction Noted  . Sulfa antibiotics Rash 10/17/2017    Family History  Problem Relation Age of Onset  . Cancer Mother 47       lung  . Cancer Father 20       brain  . Memory loss Sister     Social History   Socioeconomic History  . Marital status: Married    Spouse name: Not on file  . Number of children: Not on file  . Years of education: Not on file  . Highest education level: Not on file  Occupational History  . Not on file  Tobacco Use  . Smoking status: Former Smoker    Quit date: 10/17/1972     Years since quitting: 48.2  . Smokeless tobacco: Never Used  Vaping Use  . Vaping Use: Never used  Substance and Sexual Activity  . Alcohol use: No  . Drug use: No  . Sexual activity: Not on file  Other Topics Concern  . Not on file  Social History Narrative   In [place]; with husband; 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    Social Determinants of Health   Financial Resource Strain: Low Risk   . Difficulty of Paying Living Expenses: Not hard at all  Food Insecurity: No Food Insecurity  . Worried About Charity fundraiser in the Last Year: Never true  . Ran Out of Food in the Last Year: Never true  Transportation Needs: No Transportation Needs  . Lack of Transportation (Medical): No  . Lack of Transportation (Non-Medical): No  Physical Activity: Sufficiently Active  . Days of Exercise per Week: 7 days  . Minutes of Exercise per Session: 120 min  Stress: No Stress Concern Present  . Feeling of Stress : Not at all  Social Connections: Socially Integrated  . Frequency of Communication with Friends and Family: More than three times a week  . Frequency of Social Gatherings with Friends and Family: More than  three times a week  . Attends Religious Services: 1 to 4 times per year  . Active Member of Clubs or Organizations: Yes  . Attends Archivist Meetings: Not on file  . Marital Status: Married  Human resources officer Violence: Not At Risk  . Fear of Current or Ex-Partner: No  . Emotionally Abused: No  . Physically Abused: No  . Sexually Abused: No    Review of Systems: See HPI, otherwise negative ROS  Physical Exam: BP 122/62   Pulse (!) 58   Temp 97.6 F (36.4 C)   Ht 5\' 3"  (1.6 m)   Wt 46.7 kg   SpO2 99%   BMI 18.25 kg/m  General:   Alert,  pleasant and cooperative in NAD Head:  Normocephalic and atraumatic. Neck:  Supple; no masses or thyromegaly. Lungs:  Clear throughout to auscultation, normal respiratory  effort.    Heart:  +S1, +S2, Regular rate and rhythm, No edema. Abdomen:  Soft, nontender and nondistended. Normal bowel sounds, without guarding, and without rebound.   Neurologic:  Alert and  oriented x4;  grossly normal neurologically.  Impression/Plan: [name] is here for a colonoscopy to be performed for average risk screening.  Risks, benefits, limitations, and alternatives regarding  colonoscopy have been reviewed with the patient.  Questions have been answered.  All parties agreeable.   [name], MD  12/19/2020, 9:11 AM

## 2020-12-19 NOTE — Anesthesia Procedure Notes (Signed)
Performed by: Courage Biglow, CRNA Pre-anesthesia Checklist: Patient identified, Emergency Drugs available, Suction available, Timeout performed and Patient being monitored Patient Re-evaluated:Patient Re-evaluated prior to induction Oxygen Delivery Method: Nasal cannula Placement Confirmation: positive ETCO2       

## 2020-12-19 NOTE — Anesthesia Postprocedure Evaluation (Signed)
Anesthesia Post Note  Patient: Mary Everett  Procedure(s) Performed: COLONOSCOPY WITH PROPOFOL (N/A Rectum) POLYPECTOMY (N/A Rectum)     Patient location during evaluation: PACU Anesthesia Type: General Level of consciousness: awake and alert Pain management: pain level controlled Vital Signs Assessment: post-procedure vital signs reviewed and stable Respiratory status: spontaneous breathing and nonlabored ventilation Cardiovascular status: blood pressure returned to baseline Postop Assessment: no apparent nausea or vomiting Anesthetic complications: no   No complications documented.  Aadit Hagood [employer]

## 2020-12-19 NOTE — Transfer of Care (Signed)
Immediate Anesthesia Transfer of Care Note  Patient: Mary Everett  Procedure(s) Performed: COLONOSCOPY WITH PROPOFOL (N/A Rectum) POLYPECTOMY (N/A Rectum)  Patient Location: PACU  Anesthesia Type: General  Level of Consciousness: awake, alert  and patient cooperative  Airway and Oxygen Therapy: Patient Spontanous Breathing and Patient connected to supplemental oxygen  Post-op Assessment: Post-op Vital signs reviewed, Patient's Cardiovascular Status Stable, Respiratory Function Stable, Patent Airway and No signs of Nausea or vomiting  Post-op Vital Signs: Reviewed and stable  Complications: No complications documented.

## 2020-12-20 ENCOUNTER — Encounter: Payer: Self-pay | Admitting: Gastroenterology

## 2020-12-21 LAB — SURGICAL PATHOLOGY

## 2020-12-27 ENCOUNTER — Encounter: Payer: Self-pay | Admitting: Gastroenterology

## 2021-01-08 DIAGNOSIS — E2839 Other primary ovarian failure: Secondary | ICD-10-CM | POA: Diagnosis not present

## 2021-01-08 DIAGNOSIS — Z13820 Encounter for screening for osteoporosis: Secondary | ICD-10-CM | POA: Diagnosis not present

## 2021-01-08 DIAGNOSIS — Z1231 Encounter for screening mammogram for malignant neoplasm of breast: Secondary | ICD-10-CM | POA: Diagnosis not present

## 2021-01-08 DIAGNOSIS — M810 Age-related osteoporosis without current pathological fracture: Secondary | ICD-10-CM | POA: Diagnosis not present

## 2021-01-08 DIAGNOSIS — Z780 Asymptomatic menopausal state: Secondary | ICD-10-CM | POA: Diagnosis not present

## 2021-01-08 LAB — HM MAMMOGRAPHY

## 2021-01-08 LAB — HM DEXA SCAN

## 2021-01-18 ENCOUNTER — Ambulatory Visit (INDEPENDENT_AMBULATORY_CARE_PROVIDER_SITE_OTHER): Payer: Medicare HMO | Admitting: Internal Medicine

## 2021-01-18 ENCOUNTER — Encounter: Payer: Self-pay | Admitting: Internal Medicine

## 2021-01-18 ENCOUNTER — Other Ambulatory Visit: Payer: Self-pay

## 2021-01-18 VITALS — BP 104/68 | HR 74 | Temp 98.1°F | Resp 16 | Ht 63.0 in | Wt 110.2 lb

## 2021-01-18 DIAGNOSIS — Z13220 Encounter for screening for lipoid disorders: Secondary | ICD-10-CM | POA: Diagnosis not present

## 2021-01-18 DIAGNOSIS — M810 Age-related osteoporosis without current pathological fracture: Secondary | ICD-10-CM

## 2021-01-18 DIAGNOSIS — D709 Neutropenia, unspecified: Secondary | ICD-10-CM

## 2021-01-18 DIAGNOSIS — D72819 Decreased white blood cell count, unspecified: Secondary | ICD-10-CM

## 2021-01-18 DIAGNOSIS — D696 Thrombocytopenia, unspecified: Secondary | ICD-10-CM

## 2021-01-18 DIAGNOSIS — Z23 Encounter for immunization: Secondary | ICD-10-CM | POA: Diagnosis not present

## 2021-01-18 DIAGNOSIS — Z0000 Encounter for general adult medical examination without abnormal findings: Secondary | ICD-10-CM | POA: Diagnosis not present

## 2021-01-18 NOTE — Progress Notes (Signed)
Patient ID: Mary Everett, female   DOB: 04-07-1946, 75 y.o.   MRN: [id number]   Subjective:    Patient ID: Mary Everett, female    DOB: [birth year], 75 y.o.   MRN: [id number]  HPI This visit occurred during the SARS-CoV-2 public health emergency.  Safety protocols were in place, including screening questions prior to the visit, additional usage of staff PPE, and extensive cleaning of exam room while observing appropriate contact time as indicated for disinfecting solutions.  Patient here for her physical exam.  She is doing well.  Stays active.  No chest pain or sob.  Exercises.  No acid reflux or abdominal pain reported.  Bowels moving.  Had colonoscopy 11/2020.  Has constipation after the colonoscopy for approximately 3 weeks. Bowels back to normal now.  Discussed bone density results  - osteoporosis.  Discussed calcium, vitamin D and weight bearing exercise.  Due pneumonia vaccine.     Past Medical History:  Diagnosis Date  . Cataract   . Hypoglycemia   . Tubal pregnancy    Past Surgical History:  Procedure Laterality Date  . COLONOSCOPY WITH PROPOFOL N/A 12/19/2020   Procedure: COLONOSCOPY WITH PROPOFOL;  Surgeon: [name], MD;  Location: [facility];  Service: Endoscopy;  Laterality: N/A;  requests early priority 4  . ECTOPIC PREGNANCY SURGERY    . POLYPECTOMY N/A 12/19/2020   Procedure: POLYPECTOMY;  Surgeon: [name], MD;  Location: [facility];  Service: Endoscopy;  Laterality: N/A;   Family History  Problem Relation Age of Onset  . Cancer Mother 45       lung  . Cancer Father 64       brain  . Memory loss Sister    Social History   Socioeconomic History  . Marital status: Married    Spouse name: Not on file  . Number of children: Not on file  . Years of education: Not on file  . Highest education level: Not on file  Occupational History  . Not on file  Tobacco Use  . Smoking status: Former Smoker    Quit date: 10/17/1972    Years  since quitting: 48.2  . Smokeless tobacco: Never Used  Vaping Use  . Vaping Use: Never used  Substance and Sexual Activity  . Alcohol use: No  . Drug use: No  . Sexual activity: Not on file  Other Topics Concern  . Not on file  Social History Narrative   In [place]; with husband; 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    Social Determinants of Health   Financial Resource Strain: Low Risk   . Difficulty of Paying Living Expenses: Not hard at all  Food Insecurity: No Food Insecurity  . Worried About Charity fundraiser in the Last Year: Never true  . Ran Out of Food in the Last Year: Never true  Transportation Needs: No Transportation Needs  . Lack of Transportation (Medical): No  . Lack of Transportation (Non-Medical): No  Physical Activity: Sufficiently Active  . Days of Exercise per Week: 7 days  . Minutes of Exercise per Session: 120 min  Stress: No Stress Concern Present  . Feeling of Stress : Not at all  Social Connections: Socially Integrated  . Frequency of Communication with Friends and Family: More than three times a week  . Frequency of Social Gatherings with Friends and Family: More than three times a week  . Attends Religious Services: 1 to  4 times per year  . Active Member of Clubs or Organizations: Yes  . Attends Archivist Meetings: Not on file  . Marital Status: Married    Outpatient Encounter Medications as of 01/18/2021  Medication Sig  . BIOTIN PO Take by mouth.  . Cholecalciferol (VITAMIN D3) 25 MCG (1000 UT) CAPS Take by mouth.  . FOLIC ACID PO Take by mouth daily.  . Magnesium Oxide 200 MG TABS magnesium  1 tablet per day  . Multiple Vitamins-Minerals (PX COMPLETE SENIOR MULTIVITS) TABS multivitamin  1 tablet per day  . Multiple Vitamins-Minerals (ZINC PO) Take by mouth.  . Omega-3 Fatty Acids (RA FISH OIL) 1000 MG CAPS Fish Oil  2 capsules per day  . Probiotic Product (PROBIOTIC DAILY PO)  Take by mouth daily.   No facility-administered encounter medications on file as of 01/18/2021.    Review of Systems  Constitutional: Negative for appetite change and unexpected weight change.  HENT: Negative for congestion, sinus pressure and sore throat.   Eyes: Negative for pain and visual disturbance.  Respiratory: Negative for cough, chest tightness and shortness of breath.   Cardiovascular: Negative for chest pain, palpitations and leg swelling.  Gastrointestinal: Negative for abdominal pain, diarrhea, nausea and vomiting.  Genitourinary: Negative for difficulty urinating and dysuria.  Musculoskeletal: Negative for joint swelling and myalgias.  Skin: Negative for color change and rash.  Neurological: Negative for dizziness, light-headedness and headaches.  Hematological: Negative for adenopathy. Does not bruise/bleed easily.  Psychiatric/Behavioral: Negative for agitation and dysphoric mood.       Objective:    Physical Exam Vitals reviewed.  Constitutional:      General: She is not in acute distress.    Appearance: Normal appearance. She is well-developed.  HENT:     Head: Normocephalic and atraumatic.     Right Ear: External ear normal.  Eyes:     General: No scleral icterus.       Right eye: No discharge.        Left eye: No discharge.     Conjunctiva/sclera: Conjunctivae normal.  Neck:     Thyroid: No thyromegaly.  Cardiovascular:     Rate and Rhythm: Normal rate and regular rhythm.  Pulmonary:     Effort: No tachypnea, accessory muscle usage or respiratory distress.     Breath sounds: Normal breath sounds. No decreased breath sounds or wheezing.  Chest:  Breasts:     Right: No inverted nipple, mass, nipple discharge or tenderness (no axillary adenopathy).     Left: No inverted nipple, mass, nipple discharge or tenderness (no axilarry adenopathy).    Abdominal:     General: Bowel sounds are normal.     Palpations: Abdomen is soft.     Tenderness: There is  no abdominal tenderness.  Musculoskeletal:        General: No swelling or tenderness.     Cervical back: Neck supple. No tenderness.  Lymphadenopathy:     Cervical: No cervical adenopathy.  Skin:    Findings: No erythema or rash.  Neurological:     Mental Status: She is alert and oriented to person, place, and time.  Psychiatric:        Mood and Affect: Mood normal.        Behavior: Behavior normal.     BP 104/68   Pulse 74   Temp 98.1 F (36.7 C) (Oral)   Resp 16   Ht 5\' 3"  (1.6 m)   Wt 110 lb 3.2 oz (  50 kg)   SpO2 98%   BMI 19.52 kg/m  Wt Readings from Last 3 Encounters:  01/18/21 110 lb 3.2 oz (50 kg)  12/19/20 103 lb (46.7 kg)  11/16/20 108 lb (49 kg)     Lab Results  Component Value Date   WBC 4.2 09/12/2020   HGB 13.7 09/12/2020   HCT 41.6 09/12/2020   PLT 136 (L) 09/12/2020   GLUCOSE 95 09/12/2020   CHOL 259 (H) 12/23/2019   TRIG 42.0 12/23/2019   HDL 93.90 12/23/2019   LDLCALC 156 (H) 12/23/2019   ALT 26 09/12/2020   AST 31 09/12/2020   NA 138 09/12/2020   K 4.6 09/12/2020   CL 103 09/12/2020   CREATININE 0.54 09/12/2020   BUN 31 (H) 09/12/2020   CO2 28 09/12/2020   TSH 2.47 12/23/2019   HGBA1C 5.6 05/24/2012       Assessment & Plan:   Problem List Items Addressed This Visit    Acquired neutropenia ([facility])    Being followed by hematology.       Healthcare maintenance    Physical today 01/18/21.  PAP 12/2016 - negative with negative HPV.  Colonoscopy 11/2020 - tubular adenoma x 2 (ascending and descending colon).  Recommended f/u colonoscopy in 7 years.  Mammogram 01/08/21 - Birads I.       Leukopenia    Followed by hematology.       Osteoporosis    Bone density - osteoporosis.  Discussed calcium, vitamin D and weight bearing exercise.  Discussed treatment options.       Relevant Orders   Comprehensive metabolic panel   TSH   VITAMIN D 25 Hydroxy (Vit-D Deficiency, Fractures)   Thrombocytopenia (HCC)    Seeing hematology. Question of  ITP.  Last platelet count 136.  Continue f/u with hematology.        Other Visit Diagnoses    Routine general medical examination at a health care facility    -  Primary   Need for vaccination with 13-polyvalent pneumococcal conjugate vaccine       Relevant Orders   Pneumococcal conjugate vaccine 13-valent (Completed)   Screening cholesterol level       Relevant Orders   Lipid panel       Einar Pheasant, MD

## 2021-01-18 NOTE — Assessment & Plan Note (Addendum)
Physical today 01/18/21.  PAP 12/2016 - negative with negative HPV.  Colonoscopy 11/2020 - tubular adenoma x 2 (ascending and descending colon).  Recommended f/u colonoscopy in 7 years.  Mammogram 01/08/21 - Birads I.

## 2021-01-22 ENCOUNTER — Encounter: Payer: Self-pay | Admitting: Internal Medicine

## 2021-01-22 NOTE — Assessment & Plan Note (Signed)
Bone density - osteoporosis.  Discussed calcium, vitamin D and weight bearing exercise.  Discussed treatment options.

## 2021-01-22 NOTE — Assessment & Plan Note (Signed)
Seeing hematology. Question of ITP.  Last platelet count 136.  Continue f/u with hematology.

## 2021-01-22 NOTE — Assessment & Plan Note (Signed)
Being followed by hematology.   

## 2021-01-22 NOTE — Assessment & Plan Note (Signed)
Followed by hematology 

## 2021-02-22 ENCOUNTER — Other Ambulatory Visit: Payer: Medicare HMO

## 2021-03-13 ENCOUNTER — Ambulatory Visit: Payer: Medicare HMO | Admitting: Internal Medicine

## 2021-03-13 ENCOUNTER — Other Ambulatory Visit: Payer: Medicare HMO

## 2021-03-16 DIAGNOSIS — H43813 Vitreous degeneration, bilateral: Secondary | ICD-10-CM | POA: Diagnosis not present

## 2021-03-22 ENCOUNTER — Other Ambulatory Visit: Payer: Medicare HMO

## 2021-04-23 ENCOUNTER — Encounter: Payer: Self-pay | Admitting: Internal Medicine

## 2021-05-10 ENCOUNTER — Encounter: Payer: Self-pay | Admitting: General Surgery

## 2021-05-10 ENCOUNTER — Other Ambulatory Visit: Payer: Self-pay

## 2021-05-10 ENCOUNTER — Ambulatory Visit: Payer: Medicare HMO | Admitting: General Surgery

## 2021-05-10 VITALS — BP 97/62 | HR 71 | Temp 98.5°F | Ht 63.0 in | Wt 108.2 lb

## 2021-05-10 DIAGNOSIS — K603 Anal fistula: Secondary | ICD-10-CM

## 2021-05-10 DIAGNOSIS — L0231 Cutaneous abscess of buttock: Secondary | ICD-10-CM

## 2021-05-10 NOTE — Progress Notes (Signed)
Patient ID: Mary Everett, female   DOB: 03-26-1946, 75 y.o.   MRN: [id number]  Chief Complaint  Patient presents with   Follow-up    Buttock abscess     HPI Mary Everett is a 75 y.o. female.   I first saw her in October 2020.  My initial HPI is copied here:  "She has been referred by her primary care provider, Dr. Nicki Reaper, for evaluation of an abscess on her right buttock.  She reports that she first noticed it at the end of August.  She dealt with it for a while and treated it conservatively at home with warm compresses and sitz baths.  She ultimately sought the attention of her physician and was prescribed antibiotics with some improvement.  About 2 weeks later, the lesion came back worse.  She was prescribed doxycycline and just completed this course yesterday.  She says that the redness in the area has decreased and there has not been any spontaneous drainage.  The site is sensitive but the discomfort is localized.  She denies any fevers or chills.  She says that she and her husband are very active walkers and she notices the area rubbing or chafing against the opposite buttock."  I performed incision and drainage of the site at that visit and took cultures, but as she had been on antibiotics, there was understandably no growth.  She returned 2 weeks later with increased pain, redness, and warmth at the site.  I opened the site again, and this time there was pus present, which was cultured.  Proteus mirabilis grew out.  It was resistant to ampicillin, tetracyclines, and cefazolin, but sensitive to other cephalosporins.  She was on Keflex and seems to be improving at the time, so no changes were made to her antibiotic regimen.  I did not see any obvious signs of an epidermal inclusion cyst at the time that I performed the procedure.  She did well until May 2021 when the abscess recurred at the same site.  Repeat incision and drainage was performed.  Cultures were taken once again and grew Proteus with  similar sensitivities.  At that time, it seemed that the area was becoming irritated from consistent chafing from her vigorous walking regimen.  I suggested that she continue to keep the area clean with soap and water, perform sitz baths, and to use an antichafing stick, such as those used by long distance runners.  For over a year, she did not have any further issues but over this past weekend, noted pain, warmth, and swelling at the same site.  She had a similar site, up immediately opposite to the main lesion, it had a small whitehead and she squeezed it and apparently this resolved, but the original location remained tender and irritated.  She is here today to discuss what our next steps might be.  She has not experienced any fevers or chills.  Bowel function is normal.   Past Medical History:  Diagnosis Date   Cataract    Hypoglycemia    Tubal pregnancy     Past Surgical History:  Procedure Laterality Date   COLONOSCOPY WITH PROPOFOL N/A 12/19/2020   Procedure: COLONOSCOPY WITH PROPOFOL;  Surgeon: [name], MD;  Location: [facility];  Service: Endoscopy;  Laterality: N/A;  requests early priority 4   ECTOPIC PREGNANCY SURGERY     POLYPECTOMY N/A 12/19/2020   Procedure: POLYPECTOMY;  Surgeon: [name], MD;  Location: [facility];  Service: Endoscopy;  Laterality: N/A;    Family History  Problem Relation Age of Onset   Cancer Mother 17       lung   Cancer Father 63       brain   Memory loss Sister     Social History Social History   Tobacco Use   Smoking status: Former    Types: Cigarettes    Quit date: 10/17/1972    Years since quitting: 48.5   Smokeless tobacco: Never  Vaping Use   Vaping Use: Never used  Substance Use Topics   Alcohol use: No   Drug use: No    Allergies  Allergen Reactions   Sulfa Antibiotics Rash    Current Outpatient Medications  Medication Sig Dispense Refill   BIOTIN PO Take by mouth.      Cholecalciferol (VITAMIN D3) 25 MCG (1000 UT) CAPS Take by mouth.     FOLIC ACID PO Take by mouth daily.     Magnesium Oxide 200 MG TABS magnesium  1 tablet per day     Multiple Vitamins-Minerals (PX COMPLETE SENIOR MULTIVITS) TABS multivitamin  1 tablet per day     Multiple Vitamins-Minerals (ZINC PO) Take by mouth.     Omega-3 Fatty Acids (RA FISH OIL) 1000 MG CAPS Fish Oil  2 capsules per day     Probiotic Product (PROBIOTIC DAILY PO) Take by mouth daily.     No current facility-administered medications for this visit.    Review of Systems Review of Systems  All other systems reviewed and are negative. Or as discussed in the history of present illness.  Blood pressure 97/62, pulse 71, temperature 98.5 F (36.9 C), temperature source Oral, height 5\' 3"  (1.6 m), weight 108 lb 3.2 oz (49.1 kg), SpO2 93 %. Body mass index is 19.17 kg/m.  Physical Exam Physical Exam Vitals reviewed. Exam conducted with a chaperone present.  Constitutional:      General: She is in acute distress.     Appearance: Normal appearance.     Comments: Extremely thin Caucasian female.  HENT:     Head: Normocephalic and atraumatic.     Nose:     Comments: Covered with a mask    Mouth/Throat:     Comments: Covered with a mask Eyes:     General: No scleral icterus.       Right eye: No discharge.        Left eye: No discharge.  Cardiovascular:     Rate and Rhythm: Normal rate and regular rhythm.  Pulmonary:     Effort: Pulmonary effort is normal. No respiratory distress.  Abdominal:     General: Abdomen is flat.     Palpations: Abdomen is soft.  Genitourinary:      Comments: On the right, there is a raised and reddened area without fluctuance.  It is tender.  I can only see a faint hint of where the left-sided lesion had been. Musculoskeletal:        General: No swelling or tenderness.  Skin:    General: Skin is warm and dry.  Neurological:     General: No focal deficit present.     Mental  Status: She is alert and oriented to person, place, and time.  Psychiatric:        Mood and Affect: Mood normal.        Behavior: Behavior normal.    Data Reviewed I reviewed the culture data as discussed in history of present illness, as well as  my prior procedure notes.  I also noted that she had a colonoscopy in February of this year.  Some polyps were removed but the exam was otherwise normal.  Assessment This is a 75 year old woman who has had multiple recurrences of a small abscess on her right gluteus, near the anal verge.  Given the multiple recurrences, I am concerned that this may actually represent a fistula in ano.   Plan I have recommended that we perform an examination under anesthesia and if found, fistulotomy versus fibrin glue versus fistula plug for intervention.  If the fistula happens to pass through the anal sphincters, however, I will defer definitive management to a specialized colon and rectal surgeon.  If no fistula is found, I plan to excise the site in its entirety, hoping that this will eliminate the recurrences.  I discussed the risks of the procedure with the patient and her husband.  These include, but are not limited to, bleeding, infection, unanticipated findings on exam, fistula recurrence or persistence, wound healing problems, among others.  They would like to proceed and we will work on getting her scheduled.    Fredirick Maudlin 05/10/2021, 2:32 PM

## 2021-05-10 NOTE — H&P (View-Only) (Signed)
Patient ID: Mary Everett, female   DOB: 23-Aug-1946, 75 y.o.   MRN: [id number]  Chief Complaint  Patient presents with   Follow-up    Buttock abscess     HPI Mary Everett is a 75 y.o. female.   I first saw her in October 2020.  My initial HPI is copied here:  "She has been referred by her primary care provider, Dr. Nicki Reaper, for evaluation of an abscess on her right buttock.  She reports that she first noticed it at the end of August.  She dealt with it for a while and treated it conservatively at home with warm compresses and sitz baths.  She ultimately sought the attention of her physician and was prescribed antibiotics with some improvement.  About 2 weeks later, the lesion came back worse.  She was prescribed doxycycline and just completed this course yesterday.  She says that the redness in the area has decreased and there has not been any spontaneous drainage.  The site is sensitive but the discomfort is localized.  She denies any fevers or chills.  She says that she and her husband are very active walkers and she notices the area rubbing or chafing against the opposite buttock."  I performed incision and drainage of the site at that visit and took cultures, but as she had been on antibiotics, there was understandably no growth.  She returned 2 weeks later with increased pain, redness, and warmth at the site.  I opened the site again, and this time there was pus present, which was cultured.  Proteus mirabilis grew out.  It was resistant to ampicillin, tetracyclines, and cefazolin, but sensitive to other cephalosporins.  She was on Keflex and seems to be improving at the time, so no changes were made to her antibiotic regimen.  I did not see any obvious signs of an epidermal inclusion cyst at the time that I performed the procedure.  She did well until May 2021 when the abscess recurred at the same site.  Repeat incision and drainage was performed.  Cultures were taken once again and grew Proteus with  similar sensitivities.  At that time, it seemed that the area was becoming irritated from consistent chafing from her vigorous walking regimen.  I suggested that she continue to keep the area clean with soap and water, perform sitz baths, and to use an antichafing stick, such as those used by long distance runners.  For over a year, she did not have any further issues but over this past weekend, noted pain, warmth, and swelling at the same site.  She had a similar site, up immediately opposite to the main lesion, it had a small whitehead and she squeezed it and apparently this resolved, but the original location remained tender and irritated.  She is here today to discuss what our next steps might be.  She has not experienced any fevers or chills.  Bowel function is normal.   Past Medical History:  Diagnosis Date   Cataract    Hypoglycemia    Tubal pregnancy     Past Surgical History:  Procedure Laterality Date   COLONOSCOPY WITH PROPOFOL N/A 12/19/2020   Procedure: COLONOSCOPY WITH PROPOFOL;  Surgeon: [name], MD;  Location: [facility];  Service: Endoscopy;  Laterality: N/A;  requests early priority 4   ECTOPIC PREGNANCY SURGERY     POLYPECTOMY N/A 12/19/2020   Procedure: POLYPECTOMY;  Surgeon: [name], MD;  Location: [facility];  Service: Endoscopy;  Laterality: N/A;    Family History  Problem Relation Age of Onset   Cancer Mother 51       lung   Cancer Father 33       brain   Memory loss Sister     Social History Social History   Tobacco Use   Smoking status: Former    Types: Cigarettes    Quit date: 10/17/1972    Years since quitting: 48.5   Smokeless tobacco: Never  Vaping Use   Vaping Use: Never used  Substance Use Topics   Alcohol use: No   Drug use: No    Allergies  Allergen Reactions   Sulfa Antibiotics Rash    Current Outpatient Medications  Medication Sig Dispense Refill   BIOTIN PO Take by mouth.      Cholecalciferol (VITAMIN D3) 25 MCG (1000 UT) CAPS Take by mouth.     FOLIC ACID PO Take by mouth daily.     Magnesium Oxide 200 MG TABS magnesium  1 tablet per day     Multiple Vitamins-Minerals (PX COMPLETE SENIOR MULTIVITS) TABS multivitamin  1 tablet per day     Multiple Vitamins-Minerals (ZINC PO) Take by mouth.     Omega-3 Fatty Acids (RA FISH OIL) 1000 MG CAPS Fish Oil  2 capsules per day     Probiotic Product (PROBIOTIC DAILY PO) Take by mouth daily.     No current facility-administered medications for this visit.    Review of Systems Review of Systems  All other systems reviewed and are negative. Or as discussed in the history of present illness.  Blood pressure 97/62, pulse 71, temperature 98.5 F (36.9 C), temperature source Oral, height 5\' 3"  (1.6 m), weight 108 lb 3.2 oz (49.1 kg), SpO2 93 %. Body mass index is 19.17 kg/m.  Physical Exam Physical Exam Vitals reviewed. Exam conducted with a chaperone present.  Constitutional:      General: She is in acute distress.     Appearance: Normal appearance.     Comments: Extremely thin Caucasian female.  HENT:     Head: Normocephalic and atraumatic.     Nose:     Comments: Covered with a mask    Mouth/Throat:     Comments: Covered with a mask Eyes:     General: No scleral icterus.       Right eye: No discharge.        Left eye: No discharge.  Cardiovascular:     Rate and Rhythm: Normal rate and regular rhythm.  Pulmonary:     Effort: Pulmonary effort is normal. No respiratory distress.  Abdominal:     General: Abdomen is flat.     Palpations: Abdomen is soft.  Genitourinary:      Comments: On the right, there is a raised and reddened area without fluctuance.  It is tender.  I can only see a faint hint of where the left-sided lesion had been. Musculoskeletal:        General: No swelling or tenderness.  Skin:    General: Skin is warm and dry.  Neurological:     General: No focal deficit present.     Mental  Status: She is alert and oriented to person, place, and time.  Psychiatric:        Mood and Affect: Mood normal.        Behavior: Behavior normal.    Data Reviewed I reviewed the culture data as discussed in history of present illness, as well as  my prior procedure notes.  I also noted that she had a colonoscopy in February of this year.  Some polyps were removed but the exam was otherwise normal.  Assessment This is a 75 year old woman who has had multiple recurrences of a small abscess on her right gluteus, near the anal verge.  Given the multiple recurrences, I am concerned that this may actually represent a fistula in ano.   Plan I have recommended that we perform an examination under anesthesia and if found, fistulotomy versus fibrin glue versus fistula plug for intervention.  If the fistula happens to pass through the anal sphincters, however, I will defer definitive management to a specialized colon and rectal surgeon.  If no fistula is found, I plan to excise the site in its entirety, hoping that this will eliminate the recurrences.  I discussed the risks of the procedure with the patient and her husband.  These include, but are not limited to, bleeding, infection, unanticipated findings on exam, fistula recurrence or persistence, wound healing problems, among others.  They would like to proceed and we will work on getting her scheduled.    Fredirick Maudlin 05/10/2021, 2:32 PM

## 2021-05-10 NOTE — Patient Instructions (Signed)
Our surgery scheduler will call to schedule your surgery within 24-48 hours. Please have the [facility] surgery sheet available when speaking with her.   Anal Fistula  An anal fistula is a hole that develops between the bowel and the skin near the anus. The anus allows stool (feces) to leave the body. The anus has many tiny glands that make lubricating fluid. Sometimes, these glands become plugged and infected. This can cause a fluid-filled pocket (abscess) to form. An anal fistula often occurs when an abscess becomes infected andthen develops into a hole between the bowel and the skin. What are the causes? In most cases, an anal fistula is caused by a past or current buildup of pus around the anus (anal abscess). Other causes include: A complication of surgery. Injury to the rectum or the area around it. Using high-energy beams (radiation) to treat the area around the rectum. What increases the risk? You are more likely to develop this condition if you have certain medical conditions or diseases, including: Chronic inflammatory bowel disease, such as Crohn's disease or ulcerative colitis. Colon cancer or rectal cancer. Diverticular disease, such as diverticulitis. A sexually transmitted infection, or STI, such as gonorrhea, chlamydia, or syphilis. An infection that is caused by HIV. What are the signs or symptoms? Symptoms of this condition include: Throbbing or constant pain that may be worse while you are sitting. Swelling or irritation around the anus. Pus or blood from an opening near the anus. Pain when passing stool. Fever or chills. How is this diagnosed? This condition is diagnosed based on: A physical exam. This may include: An exam to find the external opening of the fistula. An exam with a probe or scope to help locate the internal opening of the fistula. An exam of the rectum with a gloved hand (digital rectal exam). Imaging tests that use dye to find the exact location and path  of the fistula. Tests may include: X-rays. Ultrasound. CT scan. MRI. Other tests to find the cause of the anal fistula. How is this treated? This condition is most commonly treated with surgery. The type of surgery that is used will depend on where the fistula is located and how complex the fistula is. Surgery may include: A fistulotomy. The whole fistula is opened up, and the contents are drained to promote healing. Seton placement. A silk string (seton) is placed into the fistula during a fistulotomy. This helps to drain any infection and promote healing. Advancement flap procedure. Tissue is removed from your rectum or the skin around the anus and attached to the opening of the fistula. Bioprosthetic plug. A cone-shaped plug is made from your tissue and is used to block the opening of the fistula. Some anal fistulas do not require surgery. A nonsurgical treatment option involves injecting a fibrin glue to seal the fistula. You also may beprescribed an antibiotic medicine to treat any infection. Follow these instructions at home: Medicines Take over-the-counter and prescription medicines only as told by your health care provider. If you were prescribed an antibiotic medicine, take it as told by your health care provider. Do not stop taking the antibiotic even if you start to feel better. Use a stool softener or a laxative if told to do so by your health care provider. General instructions  Eat a high-fiber diet as told by your health care provider. This can help to prevent constipation. Drink enough fluid to keep your urine pale yellow. Take a warm sitz bath for 15-20 minutes, 3-4 times  per day, or as told by your health care provider. Sitz baths can ease your pain and discomfort and help with healing. Follow good hygiene to keep the anal area as clean and dry as possible. Use wet toilet paper or a moist towelette after each bowel movement. Keep all follow-up visits as told by your health  care provider. This is important.  Contact a health care provider if you have: Increased pain that is not controlled with medicines. New redness or swelling around the anal area. New fluid, blood, or pus coming from the anal area. Tenderness or warmth around the anal area. Get help right away if you have: A fever. Severe pain. Chills or diarrhea. Severe problems urinating or having a bowel movement. Summary An anal fistula is a hole that develops between the bowel and the skin near the anus. This condition is most often caused by a buildup of pus around the anus (anal abscess). Other causes include a complication of surgery, an injury to the rectum, or the use of radiation to treat the rectal area. This condition is most commonly treated with surgery. Follow your health care provider's instructions about taking medicines, eating and drinking, or taking sitz baths. Call your health care provider if you have more pain, swelling, or blood. Get help right away if you have fever, severe pain, or problems passing urine or stool. This information is not intended to replace advice given to you by your health care provider. Make sure you discuss any questions you have with your healthcare provider. Document Revised: 02/27/2018 Document Reviewed: 02/27/2018 Elsevier Patient Education  [place].

## 2021-05-14 ENCOUNTER — Telehealth: Payer: Self-pay | Admitting: General Surgery

## 2021-05-14 NOTE — Telephone Encounter (Signed)
Patient has been advised of Pre-Admission date/time, COVID Testing date and Surgery date.  Surgery Date: 05/23/21 Preadmission Testing Date: 05/16/21 (phone 8a-1p) Covid Testing Date: Not needed.     Patient has been made aware to call [phone], between 1-3:00pm the day before surgery, to find out what time to arrive for surgery.

## 2021-05-16 ENCOUNTER — Other Ambulatory Visit: Payer: Self-pay

## 2021-05-16 ENCOUNTER — Other Ambulatory Visit: Payer: Medicare HMO

## 2021-05-16 ENCOUNTER — Other Ambulatory Visit
Admission: RE | Admit: 2021-05-16 | Discharge: 2021-05-16 | Disposition: A | Discharge status: Home / Self Care | Payer: Medicare HMO | Source: Ambulatory Visit | Attending: General Surgery | Admitting: General Surgery

## 2021-05-16 NOTE — Patient Instructions (Addendum)
Your procedure is scheduled on: May 23, 2021 [facility] Report to the Registration Desk on the 1st floor of the [facility]. To find out your arrival time, please call [phone] between 1PM - 3PM on: Tuesday May 22, 2021  REMEMBER: Instructions that are not followed completely may result in serious medical risk, up to and including death; or upon the discretion of your surgeon and anesthesiologist your surgery may need to be rescheduled.  DO NOT EAT OR DRINK after midnight the night before surgery.  No gum chewing, lozengers or hard candies.  TAKE THESE MEDICATIONS THE MORNING OF SURGERY WITH A SIP OF WATER: NONE  One week prior to surgery: Stop Anti-inflammatories (NSAIDS) such as Advil, Aleve, Ibuprofen, Motrin, Naproxen, Naprosyn and  ASPIRIN OR Aspirin based products such as Excedrin, Goodys Powder, BC Powder. Stop ANY OVER THE COUNTER supplements until after surgery. You may however, continue to take Tylenol if needed for pain up until the day of surgery.  No Alcohol for 24 hours before or after surgery.  No Smoking including e-cigarettes for 24 hours prior to surgery.  No chewable tobacco products for at least 6 hours prior to surgery.  No nicotine patches on the day of surgery.  Do not use any "recreational" drugs for at least a week prior to your surgery.  Please be advised that the combination of cocaine and anesthesia may have negative outcomes, up to and including death. If you test positive for cocaine, your surgery will be cancelled.  On the morning of surgery brush your teeth with toothpaste and water, you may rinse your mouth with mouthwash if you wish. Do not swallow any toothpaste or mouthwash.  Do not wear jewelry, make-up, hairpins, clips or nail polish.  Do not wear lotions, powders, or perfumes OR DEODORANT  Do not shave body from the neck down 48 hours prior to surgery just in case you cut yourself which could leave a site for infection.  Also, freshly  shaved skin may become irritated if using the CHG soap.  Contact lenses, hearing aids and dentures may not be worn into surgery.  Do not bring valuables to the hospital. [facility] is not responsible for any missing/lost belongings or valuables.   Use CHG Soap as directed on instruction sheet.  Fleets enema as directed.  Notify your doctor if there is any change in your medical condition (cold, fever, infection).  Wear comfortable clothing (specific to your surgery type) to the hospital.  After surgery, you can help prevent lung complications by doing breathing exercises.  Take deep breaths and cough every 1-2 hours. Your doctor may order a device called an Incentive Spirometer to help you take deep breaths. When coughing or sneezing, hold a pillow firmly against your incision with both hands. This is called "splinting." Doing this helps protect your incision. It also decreases belly discomfort.  If you are being discharged the day of surgery, you will not be allowed to drive home. You will need a responsible adult (18 years or older) to drive you home and stay with you that night.   If you are taking public transportation, you will need to have a responsible adult (18 years or older) with you. Please confirm with your physician that it is acceptable to use public transportation.   Please call the [place] Dept. at [phone] if you have any questions about these instructions.  Surgery Visitation Policy:  Patients undergoing a surgery or procedure may have one family member or  support person with them as long as that person is not COVID-19 positive or experiencing its symptoms.  That person may remain in the waiting area during the procedure.  Inpatient Visitation:    Visiting hours are 7 a.m. to 8 p.m. Inpatients will be allowed two visitors daily. The visitors may change each day during the patient's stay. No visitors under the age of 39. Any visitor under the  age of 69 must be accompanied by an adult. The visitor must pass COVID-19 screenings, use hand sanitizer when entering and exiting the patient's room and wear a mask at all times, including in the patient's room. Patients must also wear a mask when staff or their visitor are in the room. Masking is required regardless of vaccination status.

## 2021-05-21 ENCOUNTER — Encounter
Admission: RE | Admit: 2021-05-21 | Discharge: 2021-05-21 | Disposition: A | Discharge status: Home / Self Care | Payer: Medicare HMO | Source: Ambulatory Visit | Attending: General Surgery | Admitting: General Surgery

## 2021-05-21 ENCOUNTER — Other Ambulatory Visit: Payer: Self-pay

## 2021-05-21 DIAGNOSIS — R54 Age-related physical debility: Secondary | ICD-10-CM | POA: Diagnosis not present

## 2021-05-21 DIAGNOSIS — Z01810 Encounter for preprocedural cardiovascular examination: Secondary | ICD-10-CM | POA: Insufficient documentation

## 2021-05-21 DIAGNOSIS — Z01818 Encounter for other preprocedural examination: Secondary | ICD-10-CM | POA: Diagnosis not present

## 2021-05-23 ENCOUNTER — Encounter: Payer: Self-pay | Admitting: General Surgery

## 2021-05-23 ENCOUNTER — Ambulatory Visit: Payer: Medicare HMO | Admitting: Anesthesiology

## 2021-05-23 ENCOUNTER — Ambulatory Visit
Admission: RE | Admit: 2021-05-23 | Discharge: 2021-05-23 | Disposition: A | Discharge status: Home / Self Care | Payer: Medicare HMO | Attending: General Surgery | Admitting: General Surgery

## 2021-05-23 ENCOUNTER — Encounter
Admission: RE | Disposition: A | Discharge status: Home / Self Care | Payer: Self-pay | Source: Home / Self Care | Attending: General Surgery

## 2021-05-23 DIAGNOSIS — K603 Anal fistula: Secondary | ICD-10-CM | POA: Diagnosis not present

## 2021-05-23 DIAGNOSIS — M2518 Fistula, other specified site: Secondary | ICD-10-CM

## 2021-05-23 DIAGNOSIS — L0231 Cutaneous abscess of buttock: Secondary | ICD-10-CM | POA: Insufficient documentation

## 2021-05-23 SURGERY — EXAM UNDER ANESTHESIA
Anesthesia: General

## 2021-05-23 MED ORDER — BUPIVACAINE HCL (PF) 0.25 % IJ SOLN
INTRAMUSCULAR | Status: AC
  Filled 2021-05-23: qty 30

## 2021-05-23 MED ORDER — BUPIVACAINE HCL 0.25 % IJ SOLN
INTRAMUSCULAR | Status: DC | PRN
  Administered 2021-05-23: 18 mL

## 2021-05-23 MED ORDER — GELATIN ABSORBABLE 100 CM EX MISC
CUTANEOUS | Status: AC
  Filled 2021-05-23: qty 1

## 2021-05-23 MED ORDER — ACETAMINOPHEN 500 MG PO TABS
ORAL_TABLET | ORAL | Status: AC
  Administered 2021-05-23: 1000 mg via ORAL
  Filled 2021-05-23: qty 2

## 2021-05-23 MED ORDER — FENTANYL CITRATE (PF) 100 MCG/2ML IJ SOLN: 25.0000 ug | INTRAMUSCULAR | Status: DC | PRN

## 2021-05-23 MED ORDER — PHENYLEPHRINE HCL (PRESSORS) 10 MG/ML IV SOLN
INTRAVENOUS | Status: AC
  Filled 2021-05-23: qty 1

## 2021-05-23 MED ORDER — KETAMINE HCL 10 MG/ML IJ SOLN
INTRAMUSCULAR | Status: DC | PRN
  Administered 2021-05-23: 20 mg via INTRAVENOUS

## 2021-05-23 MED ORDER — OXYCODONE HCL 5 MG PO TABS: 5.0000 mg | ORAL_TABLET | Freq: Once | ORAL | Status: DC | PRN

## 2021-05-23 MED ORDER — FAMOTIDINE 20 MG PO TABS
ORAL_TABLET | ORAL | Status: AC
  Administered 2021-05-23: 20 mg via ORAL
  Filled 2021-05-23: qty 1

## 2021-05-23 MED ORDER — FENTANYL CITRATE (PF) 100 MCG/2ML IJ SOLN
INTRAMUSCULAR | Status: AC
  Filled 2021-05-23: qty 2

## 2021-05-23 MED ORDER — THROMBIN 5000 UNITS EX SOLR
CUTANEOUS | Status: AC
  Filled 2021-05-23: qty 5000

## 2021-05-23 MED ORDER — ONDANSETRON HCL 4 MG/2ML IJ SOLN
INTRAMUSCULAR | Status: DC | PRN
  Administered 2021-05-23: 4 mg via INTRAVENOUS

## 2021-05-23 MED ORDER — ACETAMINOPHEN 500 MG PO TABS: 1000.0000 mg | ORAL_TABLET | ORAL | Status: AC

## 2021-05-23 MED ORDER — FAMOTIDINE 20 MG PO TABS: 20.0000 mg | ORAL_TABLET | Freq: Once | ORAL | Status: AC

## 2021-05-23 MED ORDER — PHENYLEPHRINE HCL (PRESSORS) 10 MG/ML IV SOLN
INTRAVENOUS | Status: DC | PRN
  Administered 2021-05-23: 100 ug via INTRAVENOUS

## 2021-05-23 MED ORDER — GLYCOPYRROLATE 0.2 MG/ML IJ SOLN
INTRAMUSCULAR | Status: DC | PRN
  Administered 2021-05-23: .2 mg via INTRAVENOUS

## 2021-05-23 MED ORDER — PROPOFOL 10 MG/ML IV BOLUS
INTRAVENOUS | Status: AC
  Filled 2021-05-23: qty 40

## 2021-05-23 MED ORDER — ROCURONIUM BROMIDE 100 MG/10ML IV SOLN
INTRAVENOUS | Status: DC | PRN
  Administered 2021-05-23: 40 mg via INTRAVENOUS

## 2021-05-23 MED ORDER — BUPIVACAINE LIPOSOME 1.3 % IJ SUSP: 20.0000 mL | Freq: Once | INTRAMUSCULAR | Status: DC

## 2021-05-23 MED ORDER — FENTANYL CITRATE (PF) 100 MCG/2ML IJ SOLN
INTRAMUSCULAR | Status: DC | PRN
  Administered 2021-05-23: 25 ug via INTRAVENOUS
  Administered 2021-05-23: 50 ug via INTRAVENOUS
  Administered 2021-05-23: 25 ug via INTRAVENOUS

## 2021-05-23 MED ORDER — CHLORHEXIDINE GLUCONATE CLOTH 2 % EX PADS: 6.0000 | MEDICATED_PAD | Freq: Once | CUTANEOUS | Status: DC

## 2021-05-23 MED ORDER — PROPOFOL 10 MG/ML IV BOLUS
INTRAVENOUS | Status: DC | PRN
  Administered 2021-05-23: 100 mg via INTRAVENOUS

## 2021-05-23 MED ORDER — OXYCODONE HCL 5 MG/5ML PO SOLN: 5.0000 mg | Freq: Once | ORAL | Status: DC | PRN

## 2021-05-23 MED ORDER — ACETAMINOPHEN 500 MG PO TABS: 1000.0000 mg | ORAL_TABLET | Freq: Four times a day (QID) | ORAL | 0 refills | Status: AC | PRN

## 2021-05-23 MED ORDER — DEXAMETHASONE SODIUM PHOSPHATE 10 MG/ML IJ SOLN
INTRAMUSCULAR | Status: DC | PRN
  Administered 2021-05-23: 5 mg via INTRAVENOUS

## 2021-05-23 MED ORDER — HYDROGEN PEROXIDE 3 % EX SOLN
CUTANEOUS | Status: DC | PRN
  Administered 2021-05-23: 20

## 2021-05-23 MED ORDER — KETAMINE HCL 50 MG/ML IJ SOLN
INTRAMUSCULAR | Status: AC
  Filled 2021-05-23: qty 1

## 2021-05-23 MED ORDER — CHLORHEXIDINE GLUCONATE 0.12 % MT SOLN: 15.0000 mL | Freq: Once | OROMUCOSAL | Status: AC

## 2021-05-23 MED ORDER — IBUPROFEN 800 MG PO TABS: 800.0000 mg | ORAL_TABLET | Freq: Three times a day (TID) | ORAL | 0 refills | Status: DC | PRN

## 2021-05-23 MED ORDER — SUGAMMADEX SODIUM 200 MG/2ML IV SOLN
INTRAVENOUS | Status: DC | PRN
  Administered 2021-05-23: 100 mg via INTRAVENOUS

## 2021-05-23 MED ORDER — FLEET ENEMA 7-19 GM/118ML RE ENEM: 1.0000 | ENEMA | Freq: Once | RECTAL | Status: DC

## 2021-05-23 MED ORDER — LIDOCAINE HCL (CARDIAC) PF 100 MG/5ML IV SOSY
PREFILLED_SYRINGE | INTRAVENOUS | Status: DC | PRN
  Administered 2021-05-23: 60 mg via INTRAVENOUS

## 2021-05-23 MED ORDER — BUPIVACAINE LIPOSOME 1.3 % IJ SUSP
INTRAMUSCULAR | Status: AC
  Filled 2021-05-23: qty 20

## 2021-05-23 MED ORDER — LACTATED RINGERS IV SOLN: INTRAVENOUS | Status: DC

## 2021-05-23 MED ORDER — CHLORHEXIDINE GLUCONATE 0.12 % MT SOLN
OROMUCOSAL | Status: AC
  Administered 2021-05-23: 15 mL via OROMUCOSAL
  Filled 2021-05-23: qty 15

## 2021-05-23 MED ORDER — ORAL CARE MOUTH RINSE: 15.0000 mL | Freq: Once | OROMUCOSAL | Status: AC

## 2021-05-23 SURGICAL SUPPLY — 30 items
CANISTER SUCT 1200ML W/VALVE (MISCELLANEOUS) ×1 IMPLANT
DRAPE LAPAROTOMY 77X122 PED (DRAPES) ×2 IMPLANT
DRSG GAUZE FLUFF 36X18 (GAUZE/BANDAGES/DRESSINGS) ×1 IMPLANT
ELECT CAUTERY BLADE TIP 2.5 (TIP) ×2
ELECT REM PT RETURN 9FT ADLT (ELECTROSURGICAL) ×2
ELECTRODE CAUTERY BLDE TIP 2.5 (TIP) ×1 IMPLANT
ELECTRODE REM PT RTRN 9FT ADLT (ELECTROSURGICAL) ×1 IMPLANT
GAUZE 4X4 16PLY ~~LOC~~+RFID DBL (SPONGE) ×1 IMPLANT
GLOVE SURG SYN 6.5 ES PF (GLOVE) ×4 IMPLANT
GLOVE SURG SYN 6.5 PF PI (GLOVE) ×1 IMPLANT
GLOVE SURG UNDER LTX SZ7 (GLOVE) ×3 IMPLANT
GOWN STRL REUS W/ TWL LRG LVL3 (GOWN DISPOSABLE) ×2 IMPLANT
GOWN STRL REUS W/TWL LRG LVL3 (GOWN DISPOSABLE) ×4
KIT ANAL FISTULA (Mesh General) IMPLANT
KIT TURNOVER KIT A (KITS) ×2 IMPLANT
LABEL OR SOLS (LABEL) ×2 IMPLANT
MANIFOLD NEPTUNE II (INSTRUMENTS) ×2 IMPLANT
NEEDLE HYPO 22GX1.5 SAFETY (NEEDLE) ×3 IMPLANT
PACK BASIN MINOR ARMC (MISCELLANEOUS) ×2 IMPLANT
PAD PREP 24X41 OB/GYN DISP (PERSONAL CARE ITEMS) ×1 IMPLANT
SOL PREP PVP 2OZ (MISCELLANEOUS) ×2
SOLUTION PREP PVP 2OZ (MISCELLANEOUS) ×1 IMPLANT
SPONGE T-LAP 18X18 ~~LOC~~+RFID (SPONGE) ×2 IMPLANT
SURGILUBE 2OZ TUBE FLIPTOP (MISCELLANEOUS) ×2 IMPLANT
SUT SILK 0 CT 1 30 (SUTURE) ×1 IMPLANT
SUT VIC AB 3-0 SH 27 (SUTURE)
SUT VIC AB 3-0 SH 27X BRD (SUTURE) IMPLANT
SYR 20ML LL LF (SYRINGE) ×3 IMPLANT
SYR BULB IRRIG 60ML STRL (SYRINGE) ×2 IMPLANT
TAPE CLOTH 3X10 WHT NS LF (GAUZE/BANDAGES/DRESSINGS) ×1 IMPLANT

## 2021-05-23 NOTE — Transfer of Care (Signed)
Immediate Anesthesia Transfer of Care Note  Patient: Mary Everett  Procedure(s) Performed: EXAM UNDER ANESTHESIA, curretting of abscess cavity  Patient Location: PACU  Anesthesia Type:General  Level of Consciousness: drowsy  Airway & Oxygen Therapy: Patient Spontanous Breathing and Patient connected to face mask oxygen  Post-op Assessment: Report given to RN and Post -op Vital signs reviewed and stable  Post vital signs: Reviewed and stable  Last Vitals:  Vitals Value Taken Time  BP 138/79 05/23/21 0821  Temp    Pulse 69 05/23/21 0822  Resp 12 05/23/21 0822  SpO2 98 % 05/23/21 0822  Vitals shown include unvalidated device data.  Last Pain:  Vitals:   05/23/21 0624  TempSrc: Temporal  PainSc: 2          Complications: No notable events documented.

## 2021-05-23 NOTE — Anesthesia Procedure Notes (Signed)
Procedure Name: Intubation Date/Time: 05/23/2021 7:33 AM Performed by: Biagio Borg, CRNA Pre-anesthesia Checklist: Patient identified, Emergency Drugs available, Suction available and Patient being monitored Patient Re-evaluated:Patient Re-evaluated prior to induction Oxygen Delivery Method: Circle system utilized Preoxygenation: Pre-oxygenation with 100% oxygen Induction Type: IV induction Ventilation: Mask ventilation without difficulty Laryngoscope Size: McGraph and 3 Grade View: Grade I Tube type: Oral Tube size: 7.0 mm Number of attempts: 1 Airway Equipment and Method: Stylet Placement Confirmation: ETT inserted through vocal cords under direct vision, positive ETCO2 and breath sounds checked- equal and bilateral Secured at: 20 cm Tube secured with: Tape Dental Injury: Teeth and Oropharynx as per pre-operative assessment

## 2021-05-23 NOTE — Interval H&P Note (Signed)
History and Physical Interval Note:  05/23/2021 7:21 AM  Mary Everett  has presented today for surgery, with the diagnosis of perianal fistula.  The various methods of treatment have been discussed with the patient and family. After consideration of risks, benefits and other options for treatment, the patient has consented to  Procedure(s): EXAM UNDER ANESTHESIA, possible fistulotomy, possible fistula glue, possible fistula plug (N/A) as a surgical intervention.  The patient's history has been reviewed, patient examined, no change in status, stable for surgery.  I have reviewed the patient's chart and labs.  Questions were answered to the patient's satisfaction.     Fredirick Maudlin

## 2021-05-23 NOTE — Op Note (Signed)
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  Injection with hydrogen peroxide failed to identify an opening. There was a cavity surrounding the abscess opening that was probed extensively with both a lacrimal duct probe and a hemostat without any connection appreciated.  Indications: Mary Everett is a 75 year old woman who has had multiple recurrences of a buttock abscess that is relatively close to the anal verge.  Due to the multiple recurrences, despite incision and drainage and antibiotic therapy, the question arose as to whether or not she could potentially have a perianal fistula.  I recommended that she undergo diagnostic anoscopy and potential intervention on the fistula.  The risks were discussed and she agreed to proceed.  Procedure In Detail: The patient was identified in the preoperative holding area and brought to the operating room where she was intubated on her stretcher.  She was then turned into the prone position on the OR table.  Care was taken to appropriately pad all bony prominences.  Her arms were supported on the OR table arm boards and her body supported on a purpose-made chest roll.  The table was flexed and the buttocks taped laterally to provide exposure.  She was prepped and draped in standard fashion.  A timeout was performed confirming her identity, the procedure being performed, her allergies, and all necessary equipment was available.  The skin and subcutaneous tissue surrounding the abscess site were injected with quarter percent bupivacaine.  A lubricated Hill-Ferguson  anoscope was inserted into the rectum.  Circumferential evaluation of the anorectal mucosa was performed.  A lacrimal duct probe was placed through a pinpoint opening in the site of the fistula.  Careful exploration was performed so as not to artificially open a fistula.  There was a cavity that seemed to track both towards the vagina and towards the rectum, but there was no connection between either orifice.  I then placed a 22-gauge angiocatheter into the opening and injected hydrogen peroxide.  No bubbling was seen in the vagina or rectum.  I elected to make a small incision in the site of the abscess.  This was done with a #11 blade.  No purulent contents were present.  Further probing of the cavity was done with both a cotton tip applicator and a hemostat.  No additional connections, loculations, or additional cavities were appreciated.  A small curette was used to scrape the inside of the abscess cavity.  It was then packed with quarter inch packing strips.  Fluffs and mesh underpants were used as a dressing.  The patient was turned supine on her stretcher where she was safely awakened, extubated, and taken to the postanesthesia care unit in good condition.  EBL: Less than 1 cc  IVF: See anesthesia record  Specimen(s): None  Complications: none immediately apparent.  None  Counts: all needles, instruments, and sponges were counted and reported to be correct in number at the end of the case.   I was present for and participated in the entire operation.  Fredirick Maudlin 8:34 AM

## 2021-05-23 NOTE — Anesthesia Preprocedure Evaluation (Addendum)
Anesthesia Evaluation  Patient identified by MRN, date of birth, ID band Patient awake    Reviewed: Allergy & Precautions, NPO status , Patient's Chart, lab work & pertinent test results  History of Anesthesia Complications Negative for: history of anesthetic complications  Airway Mallampati: III  TM Distance: >3 FB Neck ROM: limited    Dental  (+) Chipped, Poor Dentition   Pulmonary neg shortness of breath, former smoker,    Pulmonary exam normal        Cardiovascular Exercise Tolerance: Good (-) angina(-) Past MI and (-) DOE negative cardio ROS Normal cardiovascular exam     Neuro/Psych negative neurological ROS  negative psych ROS   GI/Hepatic negative GI ROS, Neg liver ROS, neg GERD  ,  Endo/Other  negative endocrine ROS  Renal/GU      Musculoskeletal   Abdominal   Peds  Hematology negative hematology ROS (+)   Anesthesia Other Findings Past Medical History: No date: Cataract No date: Hypoglycemia 11/21/2017: Osteoporosis No date: Tubal pregnancy  Past Surgical History: No date: CATARACT EXTRACTION W/ INTRAOCULAR LENS IMPLANT; Bilateral 12/19/2020: COLONOSCOPY WITH PROPOFOL; N/A     Comment:  Procedure: COLONOSCOPY WITH PROPOFOL;  Surgeon:               [name], MD;  Location: [place] [facility];              Service: Endoscopy;  Laterality: N/A;  requests               early priority 4 No date: ECTOPIC PREGNANCY SURGERY 12/19/2020: POLYPECTOMY; N/A     Comment:  Procedure: POLYPECTOMY;  Surgeon: [name],               MD;  Location: [place];  Service: Endoscopy;               Laterality: N/A;  BMI    Body Mass Index: 19.13 kg/m      Reproductive/Obstetrics negative OB ROS                             Anesthesia Physical Anesthesia Plan  ASA: 2  Anesthesia Plan: General ETT   Post-op Pain Management:    Induction:  Intravenous  PONV Risk Score and Plan: Dexamethasone, Ondansetron, Midazolam and Treatment may vary due to age or medical condition  Airway Management Planned: Oral ETT  Additional Equipment:   Intra-op Plan:   Post-operative Plan: Extubation in OR  Informed Consent: I have reviewed the patients History and Physical, chart, labs and discussed the procedure including the risks, benefits and alternatives for the proposed anesthesia with the patient or authorized representative who has indicated his/her understanding and acceptance.     Dental Advisory Given  Plan Discussed with: Anesthesiologist, CRNA and Surgeon  Anesthesia Plan Comments: (Patient consented for risks of anesthesia including but not limited to:  - adverse reactions to medications - damage to eyes, teeth, lips or other oral mucosa - nerve damage due to positioning  - sore throat or hoarseness - Damage to heart, brain, nerves, lungs, other parts of body or loss of life  Patient voiced understanding.)       Anesthesia Quick Evaluation

## 2021-05-23 NOTE — Discharge Instructions (Addendum)
AMBULATORY SURGERY  ?DISCHARGE INSTRUCTIONS ? ? ?The drugs that you were given will stay in your system until tomorrow so for the next 24 hours you should not: ? ?Drive an automobile ?Make any legal decisions ?Drink any alcoholic beverage ? ? ?You may resume regular meals tomorrow.  Today it is better to start with liquids and gradually work up to solid foods. ? ?You may eat anything you prefer, but it is better to start with liquids, then soup and crackers, and gradually work up to solid foods. ? ? ?Please notify your doctor immediately if you have any unusual bleeding, trouble breathing, redness and pain at the surgery site, drainage, fever, or pain not relieved by medication. ? ? ? ?Additional Instructions: ? ? ? ?Please contact your physician with any problems or Same Day Surgery at [phone], Monday through Friday 6 am to 4 pm, or [place] [facility] Main number at [phone].  ?

## 2021-05-23 NOTE — Anesthesia Postprocedure Evaluation (Signed)
Anesthesia Post Note  Patient: Mary Everett  Procedure(s) Performed: EXAM UNDER ANESTHESIA, curretting of abscess cavity  Patient location during evaluation: PACU Anesthesia Type: General Level of consciousness: awake and alert and oriented Pain management: pain level controlled Vital Signs Assessment: post-procedure vital signs reviewed and stable Respiratory status: spontaneous breathing, nonlabored ventilation and respiratory function stable Cardiovascular status: blood pressure returned to baseline and stable Postop Assessment: no signs of nausea or vomiting Anesthetic complications: no   No notable events documented.   Last Vitals:  Vitals:   05/23/21 0857 05/23/21 0912  BP: 122/60 (!) 124/49  Pulse: 62 (!) 57  Resp: 18 16  Temp: (!) 36.1 C (!) 36.3 C  SpO2: 100% 97%    Last Pain:  Vitals:   05/23/21 0912  TempSrc: Temporal  PainSc: 0-No pain                 Liliani Bobo

## 2021-05-24 ENCOUNTER — Telehealth: Payer: Self-pay

## 2021-05-24 NOTE — Telephone Encounter (Signed)
Spoke with patient- she stated she is doing well-had a little discomfort starting today-she stated her packing is coming out-she was instructed to wash after every bowel movement and take sitz baths multiple times daily and to keep her scheduled appointment next week.

## 2021-05-30 ENCOUNTER — Other Ambulatory Visit (INDEPENDENT_AMBULATORY_CARE_PROVIDER_SITE_OTHER): Payer: Medicare HMO

## 2021-05-30 ENCOUNTER — Other Ambulatory Visit: Payer: Self-pay

## 2021-05-30 DIAGNOSIS — M810 Age-related osteoporosis without current pathological fracture: Secondary | ICD-10-CM

## 2021-05-30 DIAGNOSIS — Z13220 Encounter for screening for lipoid disorders: Secondary | ICD-10-CM | POA: Diagnosis not present

## 2021-05-30 DIAGNOSIS — E7800 Pure hypercholesterolemia, unspecified: Secondary | ICD-10-CM | POA: Diagnosis not present

## 2021-05-30 LAB — COMPREHENSIVE METABOLIC PANEL
ALT: 24 U/L (ref 0–35)
AST: 26 U/L (ref 0–37)
Albumin: 3.9 g/dL (ref 3.5–5.2)
Alkaline Phosphatase: 52 U/L (ref 39–117)
BUN: 23 mg/dL (ref 6–23)
CO2: 28 mEq/L (ref 19–32)
Calcium: 8.7 mg/dL (ref 8.4–10.5)
Chloride: 102 mEq/L (ref 96–112)
Creatinine, Ser: 0.52 mg/dL (ref 0.40–1.20)
GFR: 92.17 mL/min (ref >60.00)
Glucose, Bld: 88 mg/dL (ref 70–99)
Potassium: 4 mEq/L (ref 3.5–5.1)
Sodium: 137 mEq/L (ref 135–145)
Total Bilirubin: 0.5 mg/dL (ref 0.2–1.2)
Total Protein: 6.4 g/dL (ref 6.0–8.3)

## 2021-05-30 LAB — LIPID PANEL
Cholesterol: 228 mg/dL — ABNORMAL HIGH (ref 0–200)
HDL: 75.6 mg/dL (ref >39.00)
LDL Cholesterol: 143 mg/dL — ABNORMAL HIGH (ref 0–99)
NonHDL: 152.68
Total CHOL/HDL Ratio: 3
Triglycerides: 49 mg/dL (ref 0.0–149.0)
VLDL: 9.8 mg/dL (ref 0.0–40.0)

## 2021-05-30 LAB — TSH: TSH: 2.68 u[IU]/mL (ref 0.35–5.50)

## 2021-05-30 LAB — VITAMIN D 25 HYDROXY (VIT D DEFICIENCY, FRACTURES): VITD: 63.05 ng/mL (ref 30.00–100.00)

## 2021-05-31 ENCOUNTER — Ambulatory Visit (INDEPENDENT_AMBULATORY_CARE_PROVIDER_SITE_OTHER): Payer: Medicare HMO | Admitting: General Surgery

## 2021-05-31 ENCOUNTER — Encounter: Payer: Self-pay | Admitting: General Surgery

## 2021-05-31 VITALS — BP 95/60 | HR 84 | Temp 98.4°F | Ht 63.0 in | Wt 109.0 lb

## 2021-05-31 DIAGNOSIS — L0231 Cutaneous abscess of buttock: Secondary | ICD-10-CM

## 2021-05-31 NOTE — Patient Instructions (Signed)
Try using 2 over the counter Ibuprofen to rotate with the Tylenol instead to see if this is easier on your stomach.   Continue to do the sitz baths and change dressings as needed.  Follow-up with our office as needed.  Please call and ask to speak with a nurse if you develop questions or concerns.

## 2021-05-31 NOTE — Progress Notes (Signed)
Mary Everett is here today for a postoperative visit.  She is a 75 year old woman whom I have seen on multiple occasions for a recurrent right buttock abscess.  Due to the multiple recurrences, the possibility of a fistula in ano was considered.  She was taken to the operating room on May 23, 2021.  There, despite multiple attempts to identify a fistulous tract, including probing with a lacrimal duct probe, extensive inspection of the anal rectal canal, and injection of hydrogen peroxide, no connection could be identified.  I ultimately elected to roughly curette the abscess cavity in the hopes that it would potentially scarring.  She is here today for follow-up.  She says that the liposomal bupivacaine injection was extremely helpful during the first 2 days postop.  She has had some ongoing soreness and has been taking ibuprofen and Tylenol.  This enables her to sit more comfortably.  She has been changing the dressing frequently and performing sitz bath's.  She denies any fevers or chills.  The drainage is not purulent.  Today's Vitals   05/31/21 1341  BP: 95/60  Pulse: 84  Temp: 98.4 F (36.9 C)  SpO2: 95%  Weight: 109 lb (49.4 kg)  Height: '5\' 3"'$  (1.6 m)   Body mass index is 19.31 kg/m. Focused examination of the surgical site demonstrates that there is still a normal and expected amount of postoperative tissue edema, but no purulent material can be expressed.  The opening of the wound is a little less than half a centimeter.  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  None was found in the operating room.  She should continue to perform local wound care.  If this should recur in the future, I think a referral to colon and rectal surgery for a more sophisticated evaluation might be considered.  For now, I will see her on an as-needed basis.

## 2021-06-08 ENCOUNTER — Other Ambulatory Visit: Payer: Self-pay | Admitting: *Deleted

## 2021-06-08 DIAGNOSIS — D696 Thrombocytopenia, unspecified: Secondary | ICD-10-CM

## 2021-06-11 ENCOUNTER — Inpatient Hospital Stay: Payer: Medicare HMO | Admitting: Internal Medicine

## 2021-06-11 ENCOUNTER — Other Ambulatory Visit: Payer: Self-pay

## 2021-06-11 ENCOUNTER — Encounter: Payer: Self-pay | Admitting: Internal Medicine

## 2021-06-11 ENCOUNTER — Inpatient Hospital Stay: Payer: Medicare HMO | Attending: Internal Medicine

## 2021-06-11 DIAGNOSIS — D696 Thrombocytopenia, unspecified: Secondary | ICD-10-CM

## 2021-06-11 DIAGNOSIS — Z79899 Other long term (current) drug therapy: Secondary | ICD-10-CM | POA: Insufficient documentation

## 2021-06-11 DIAGNOSIS — D709 Neutropenia, unspecified: Secondary | ICD-10-CM | POA: Insufficient documentation

## 2021-06-11 DIAGNOSIS — Z87891 Personal history of nicotine dependence: Secondary | ICD-10-CM | POA: Insufficient documentation

## 2021-06-11 LAB — CBC WITH DIFFERENTIAL/PLATELET
Abs Immature Granulocytes: 0.03 10*3/uL (ref 0.00–0.07)
Basophils Absolute: 0.1 10*3/uL (ref 0.0–0.1)
Basophils Relative: 1 %
Eosinophils Absolute: 0.1 10*3/uL (ref 0.0–0.5)
Eosinophils Relative: 2 %
HCT: 40.6 % (ref 36.0–46.0)
Hemoglobin: 13.4 g/dL (ref 12.0–15.0)
Immature Granulocytes: 1 %
Lymphocytes Relative: 36 %
Lymphs Abs: 1.7 10*3/uL (ref 0.7–4.0)
MCH: 28.5 pg (ref 26.0–34.0)
MCHC: 33 g/dL (ref 30.0–36.0)
MCV: 86.4 fL (ref 80.0–100.0)
Monocytes Absolute: 1.1 10*3/uL — ABNORMAL HIGH (ref 0.1–1.0)
Monocytes Relative: 24 %
Neutro Abs: 1.8 10*3/uL (ref 1.7–7.7)
Neutrophils Relative %: 36 %
Platelets: 191 10*3/uL (ref 150–400)
RBC: 4.7 MIL/uL (ref 3.87–5.11)
RDW: 14.6 % (ref 11.5–15.5)
Smear Review: NORMAL
WBC: 4.7 10*3/uL (ref 4.0–10.5)
nRBC: 0 % (ref 0.0–0.2)

## 2021-06-11 LAB — COMPREHENSIVE METABOLIC PANEL
ALT: 25 U/L (ref 0–44)
AST: 27 U/L (ref 15–41)
Albumin: 3.9 g/dL (ref 3.5–5.0)
Alkaline Phosphatase: 65 U/L (ref 38–126)
Anion gap: 6 (ref 5–15)
BUN: 29 mg/dL — ABNORMAL HIGH (ref 8–23)
CO2: 30 mmol/L (ref 22–32)
Calcium: 8.9 mg/dL (ref 8.9–10.3)
Chloride: 102 mmol/L (ref 98–111)
Creatinine, Ser: 0.5 mg/dL (ref 0.44–1.00)
GFR, Estimated: >60 mL/min (ref >60)
Glucose, Bld: 98 mg/dL (ref 70–99)
Potassium: 4.1 mmol/L (ref 3.5–5.1)
Sodium: 138 mmol/L (ref 135–145)
Total Bilirubin: 0.5 mg/dL (ref 0.3–1.2)
Total Protein: 6.2 g/dL — ABNORMAL LOW (ref 6.5–8.1)

## 2021-06-11 NOTE — Progress Notes (Signed)
Mary Everett CONSULT NOTE  Patient Care Team: Einar Pheasant, MD as PCP - General (Internal Medicine)  CHIEF COMPLAINTS/PURPOSE OF CONSULTATION: Thrombocytopenia/neutropenia  HEMATOLOGY HISTORY  # THROMBOCYTOPENIA/ MILD LEUCOPENIA [march 2021platelets >100,000; ANC- 1.1; N-Hb]. [place] [from 2013-wnl; 2016- platelts-140s]  # Sinus infection [2019]; buttock abscess [Dr.Canon]; right eye chalazion [March 2020]; May 2021- I&D of the buttocks; PO ABx   # ? 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  HISTORY OF PRESENTING ILLNESS:  Mary Everett 75 y.o.  female pleasant patient history of thrombocytopenia/leukopenia is here for follow-up.  Patient again had a perirectal abscess in July/August 2022.  Patient states that she did not receive any antibiotics.  However was evaluated in the OR-no fistula noted.  Otherwise no bleeding.  No nausea no vomiting.   Review of Systems  Constitutional:  Negative for chills, diaphoresis, fever and malaise/fatigue.  HENT:  Negative for nosebleeds and sore throat.   Eyes:  Negative for double vision.  Respiratory:  Negative for cough, hemoptysis, sputum production, shortness of breath and wheezing.   Cardiovascular:  Negative for chest pain, palpitations, orthopnea and leg swelling.  Gastrointestinal:  Negative for abdominal pain, blood in stool, constipation, diarrhea, heartburn, melena, nausea and vomiting.  Genitourinary:  Negative for dysuria, frequency and urgency.  Musculoskeletal:  Negative for back pain and joint pain.  Skin: Negative.  Negative for itching and rash.  Neurological:  Negative for dizziness, tingling, focal weakness, weakness and headaches.  Endo/Heme/Allergies:  Does not bruise/bleed easily.  Psychiatric/Behavioral:  Negative for depression. The patient is not nervous/anxious and does not have  insomnia.     MEDICAL HISTORY:  Past Medical History:  Diagnosis Date   Cataract    Hypoglycemia    Osteoporosis 11/21/2017   Tubal pregnancy     SURGICAL HISTORY: Past Surgical History:  Procedure Laterality Date   CATARACT EXTRACTION W/ INTRAOCULAR LENS IMPLANT Bilateral    COLONOSCOPY WITH PROPOFOL N/A 12/19/2020   Procedure: COLONOSCOPY WITH PROPOFOL;  Surgeon: [name], MD;  Location: [facility];  Service: Endoscopy;  Laterality: N/A;  requests early priority 4   ECTOPIC PREGNANCY SURGERY     POLYPECTOMY N/A 12/19/2020   Procedure: POLYPECTOMY;  Surgeon: [name], MD;  Location: [facility];  Service: Endoscopy;  Laterality: N/A;    SOCIAL HISTORY: Social History   Socioeconomic History   Marital status: Married    Spouse name: Not on file   Number of children: Not on file   Years of education: Not on file   Highest education level: Not on file  Occupational History   Not on file  Tobacco Use   Smoking status: Former    Types: Cigarettes    Quit date: 10/17/1972    Years since quitting: 48.6   Smokeless tobacco: Never  Vaping Use   Vaping Use: Never used  Substance and Sexual Activity   Alcohol use: No   Drug use: No   Sexual activity: Not on file  Other Topics Concern   Not on file  Social History Narrative   In [place]; with husband; 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    Social Determinants of Health   Financial Resource Strain: Low Risk    Difficulty of Paying Living Expenses: Not hard at all  Food Insecurity: No Food Insecurity   Worried About Charity fundraiser  in the Last Year: Never true   [place] in the Last Year: Never true  Transportation Needs: No Transportation Needs   Lack of Transportation (Medical): No   Lack of Transportation (Non-Medical): No  Physical Activity: Sufficiently Active   Days of Exercise per Week: 7 days   Minutes of  Exercise per Session: 120 min  Stress: No Stress Concern Present   Feeling of Stress : Not at all  Social Connections: Socially Integrated   Frequency of Communication with Friends and Family: More than three times a week   Frequency of Social Gatherings with Friends and Family: More than three times a week   Attends Religious Services: 1 to 4 times per year   Active Member of [employer] or Organizations: Yes   Attends Music therapist: Not on file   Marital Status: Married  Human resources officer Violence: Not At Risk   Fear of Current or Ex-Partner: No   Emotionally Abused: No   Physically Abused: No   Sexually Abused: No    FAMILY HISTORY: Family History  Problem Relation Age of Onset   Cancer Mother 52       lung   Cancer Father 36       brain   Memory loss Sister     ALLERGIES:  is allergic to sulfa antibiotics.  MEDICATIONS:  Current Outpatient Medications  Medication Sig Dispense Refill   Biotin 5 MG TABS Take 10 mg by mouth daily.     Ca Cit Malate-Cholecalciferol (CALCIUM CITRATE MALATE-VIT D PO) Take 3 tablets by mouth in the morning and at bedtime.     Cod Liver Oil CAPS Take 1 capsule by mouth daily.     folic acid (FOLVITE) Q000111Q MCG tablet Take 800 mcg by mouth daily.     ibuprofen (ADVIL) 800 MG tablet Take 1 tablet (800 mg total) by mouth every 8 (eight) hours as needed. 30 tablet 0   Magnesium Oxide 420 (252 Mg) MG TABS Take 420 mg by mouth every evening.     Multiple Vitamins-Minerals (PX COMPLETE SENIOR MULTIVITS) TABS Take 1 tablet by mouth daily.     Multiple Vitamins-Minerals (ZINC PO) Take 30 mg by mouth daily.     Probiotic Product (PROBIOTIC DAILY PO) Take 1 capsule by mouth daily.     No current facility-administered medications for this visit.     Marland Kitchen  PHYSICAL EXAMINATION:   Vitals:   06/11/21 1028  BP: (!) 107/51  Pulse: 69  Resp: 20  Temp: 97.6 F (36.4 C)   Filed Weights   06/11/21 1028  Weight: 109 lb (49.4 kg)    Physical  Exam HENT:     Head: Normocephalic and atraumatic.     Mouth/Throat:     Pharynx: No oropharyngeal exudate.  Eyes:     Pupils: Pupils are equal, round, and reactive to light.  Cardiovascular:     Rate and Rhythm: Normal rate and regular rhythm.  Pulmonary:     Effort: Pulmonary effort is normal. No respiratory distress.     Breath sounds: Normal breath sounds. No wheezing.  Abdominal:     General: Bowel sounds are normal. There is no distension.     Palpations: Abdomen is soft. There is no mass.     Tenderness: no abdominal tenderness There is no guarding or rebound.  Musculoskeletal:        General: No tenderness. Normal range of motion.     Cervical back: Normal range of motion  and neck supple.  Skin:    General: Skin is warm.  Neurological:     Mental Status: She is alert and oriented to person, place, and time.  Psychiatric:        Mood and Affect: Affect normal.     LABORATORY DATA:  I have reviewed the data as listed Lab Results  Component Value Date   WBC 4.7 06/11/2021   HGB 13.4 06/11/2021   HCT 40.6 06/11/2021   MCV 86.4 06/11/2021   PLT 191 06/11/2021   Recent Labs    09/12/20 0949 05/30/21 0734 06/11/21 1006  NA 138 137 138  K 4.6 4.0 4.1  CL 103 102 102  CO2 '28 28 30  '$ GLUCOSE 95 88 98  BUN 31* 23 29*  CREATININE 0.54 0.52 0.50  CALCIUM 8.7* 8.7 8.9  GFRNONAA >60  --  >60  PROT 6.3* 6.4 6.2*  ALBUMIN 3.9 3.9 3.9  AST '31 26 27  '$ ALT '26 24 25  '$ ALKPHOS 66 52 65  BILITOT 0.7 0.5 0.5     No results found.  ASSESSMENT & PLAN:   Thrombocytopenia ([place]) # Mild thrombocytopenia-> 100,000; normal hemoglobin.  Today platelet count 198; kely ITP.  Asymptomatic monitor for now. STABLE.   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 UNLikley etiology of patient's frequent buttock abscesses [s/p AUG 2022- Dr.Cannon.].  Defer to surgery for further management.  # DISPOSITION:  # Follow up as  needed- Dr.B  Cc; Dr.Scott.     Cammie Sickle, MD 06/11/2021 12:52 PM

## 2021-06-11 NOTE — Assessment & Plan Note (Addendum)
#   Mild thrombocytopenia-> 100,000; normal hemoglobin.  Today platelet count 198; kely ITP.  Asymptomatic monitor for now. STABLE.   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 UNLikley etiology of patient's frequent buttock abscesses [s/p AUG 2022- Dr.Cannon.].  Defer to surgery for further management.  #Since patient is clinically stable I think is reasonable for the patient to follow-up with PCP/can follow-up with [place] as needed.  Patient comfortable with the plan; to call us if any questions or concerns in the interim.  # DISPOSITION:  # Follow up as needed- Dr.B  Cc; Dr.Scott.

## 2021-06-27 ENCOUNTER — Other Ambulatory Visit: Payer: Self-pay

## 2021-06-27 ENCOUNTER — Ambulatory Visit
Admission: EM | Admit: 2021-06-27 | Discharge: 2021-06-27 | Disposition: A | Discharge status: Home / Self Care | Payer: Medicare HMO | Attending: Emergency Medicine | Admitting: Emergency Medicine

## 2021-06-27 ENCOUNTER — Encounter: Payer: Self-pay | Admitting: Emergency Medicine

## 2021-06-27 DIAGNOSIS — T148XXA Other injury of unspecified body region, initial encounter: Secondary | ICD-10-CM

## 2021-06-27 DIAGNOSIS — S50311A Abrasion of right elbow, initial encounter: Secondary | ICD-10-CM

## 2021-06-27 NOTE — Discharge Instructions (Addendum)
Refrain from applying any more ointment to your wound and allow the scab to form.  Keep the wound covered with a nonadherent dressing when out in public and open to air at home to encourage scab formation.  If you develop any swelling at the wound site, heat at the wound site, increase in drainage, red streaks going up your arm, or fever please return for reevaluation.

## 2021-06-27 NOTE — ED Triage Notes (Addendum)
Pt presents today with c/o of pain/redness to right elbow after she tripped on a ledge fell into side of brick house approx 10 days ago. She has been treating site with vaseline and applying gauze.

## 2021-06-27 NOTE — ED Provider Notes (Signed)
[facility]    CSN: [id number] Arrival date & time: 06/27/21  1813      History   Chief Complaint Chief Complaint  Patient presents with   Abrasion    Right elbow    HPI Mary Everett is a 75 y.o. female.   HPI  75 year old female here for evaluation of abrasion to right elbow.  Patient reports that 10 days ago she stumbled and fell against the concrete wall at her residence sustaining an abrasion to the outer aspect of her right elbow.  She is here because she is concerned that it has not healed more than it has.  She has been keeping it clean and dry but also has been applying Vaseline and keeping a loose gauze dressing over top for the past 10 days.  She states that it is draining a serosanguineous drainage still.  She denies any surrounding redness, heat, or fever.  She states that she had a Tdap update approximately 6 years ago.  Past Medical History:  Diagnosis Date   Cataract    Hypoglycemia    Osteoporosis 11/21/2017   Tubal pregnancy     Patient Active Problem List   Diagnosis Date Noted   Special screening for malignant neoplasms, colon    Polyp of colon    Wrist fracture 07/09/2020   Acquired neutropenia ([place]) 01/13/2020   Healthcare maintenance 01/08/2020   Leukopenia 01/08/2020   Thrombocytopenia ([place]) 01/08/2020   Eyelid dermatitis, infectious 01/05/2020   Abscess of buttock 08/15/2019   Colon cancer screening 08/15/2019   Breast cancer screening 08/15/2019   Acute non-recurrent maxillary sinusitis 10/26/2018   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   Insect bite 05/19/2018   Osteoporosis 11/21/2017   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      Home Medications    Prior to Admission medications   Medication Sig Start Date End Date Taking? Authorizing Provider  Biotin 5 MG TABS Take 10 mg by mouth daily.    [provider]  Ca Cit Malate-Cholecalciferol (CALCIUM CITRATE MALATE-VIT D PO) Take 3 tablets by mouth in the morning and at bedtime.    [provider]  [facility] Liver Oil CAPS Take 1 capsule by mouth daily.    [provider]  folic acid (FOLVITE) Q000111Q MCG tablet Take 800 mcg by mouth daily.    [provider]  ibuprofen (ADVIL) 800 MG tablet Take 1 tablet (800 mg total) by mouth every 8 (eight) hours as needed. 05/23/21   Fredirick Maudlin, MD  Magnesium Oxide 420 (252 Mg) MG TABS Take 420 mg by mouth every evening.    [provider]  Multiple Vitamins-Minerals (PX COMPLETE SENIOR MULTIVITS) TABS Take 1 tablet by mouth daily.    [provider]  Multiple Vitamins-Minerals (ZINC PO) Take 30 mg by mouth daily.    [provider]  Probiotic Product (PROBIOTIC DAILY PO) Take 1 capsule by  mouth daily.    [provider]    Family History Family History  Problem Relation Age of Onset   Cancer Mother 34       lung   Cancer Father 4       brain   Memory loss Sister     Social History Social History   Tobacco Use   Smoking status: Former    Types: Cigarettes    Quit date: 10/17/1972    Years since quitting: 48.7   Smokeless tobacco: Never  Vaping Use   Vaping Use: Never used  Substance Use Topics   Alcohol use: No   Drug use: No     Allergies   Sulfa antibiotics   Review of Systems Review of Systems  Constitutional:  Negative for activity change, appetite change and  fever.  Musculoskeletal:  Negative for arthralgias and joint swelling.  Skin:  Positive for wound. Negative for color change.  Hematological: Negative.   Psychiatric/Behavioral: Negative.      Physical Exam Triage Vital Signs ED Triage Vitals  Enc Vitals Group     BP 06/27/21 1842 111/60     Pulse Rate 06/27/21 1842 64     Resp 06/27/21 1842 18     Temp 06/27/21 1842 98.3 F (36.8 C)     Temp Source 06/27/21 1842 Oral     SpO2 06/27/21 1842 97 %     Weight --      Height --      Head Circumference --      Peak Flow --      Pain Score 06/27/21 1839 0     Pain Loc --      Pain Edu? --      Excl. in [place]? --    No data found.  Updated Vital Signs BP 111/60 (BP Location: Left Arm)   Pulse 64   Temp 98.3 F (36.8 C) (Oral)   Resp 18   SpO2 97%   Visual Acuity Right Eye Distance:   Left Eye Distance:   Bilateral Distance:    Right Eye Near:   Left Eye Near:    Bilateral Near:     Physical Exam Vitals and nursing note reviewed.  Constitutional:      General: She is not in acute distress.    Appearance: Normal appearance. She is normal weight. She is not ill-appearing.  HENT:     Head: Normocephalic and atraumatic.  Musculoskeletal:        General: No swelling, tenderness or deformity.  Skin:    General: Skin is warm and dry.     Capillary Refill: Capillary refill takes less than 2 seconds.     Findings: Erythema and rash present.  Neurological:     General: No focal deficit present.     Mental Status: She is alert and oriented to person, place, and time.  Psychiatric:        Mood and Affect: Mood normal.        Behavior: Behavior normal.        Thought Content: Thought content normal.        Judgment: Judgment normal.     UC Treatments / Results  Labs (all labs ordered are listed, but only abnormal results are displayed) Labs Reviewed - No data to display  EKG   Radiology No results found.  Procedures Procedures (including critical care  time)  Medications Ordered in UC Medications - No data to display  Initial Impression / Assessment  and Plan / UC Course  I have reviewed the triage vital signs and the nursing notes.  Pertinent labs & imaging results that were available during my care of the patient were reviewed by me and considered in my medical decision making (see chart for details).  Patient very pleasant, nontoxic-appearing 29 old female here for evaluation of an abrasion on her right posterior lateral elbow that she sustained 10 days ago when she lost her balance and fell onto a concrete wall.  She has been keeping clean and dry has been applying Vaseline and a loose gauze dressing for the last 10 days.  She denies any red streaks going up her arm, heat from the wound, or fever.  Physical exam reveals a silver dollar area tissue defect with mild surrounding erythema around the wound edges.  There is a thin fibrin sheath over top of the wound bed.  The wound is not tender to the touch, there are no red streaks ascending the arm, and there is no swelling or bony tenderness near the wound.  I suspect patient's application of Vaseline is macerating the wound and preventing a scab from forming so I have asked her to stop doing this.  I have also advised her to apply a nonadherent dressing when she is out in public and to leave the area open to air when she is at home so the scab can form.  I do not feel the need for antibiotics at this time as there is no signs of localized infection.  I did review return precautions with the patient and she verbalized understanding of same.  Nonadherent dressing applied prior to patient's discharge.   Final Clinical Impressions(s) / UC Diagnoses   Final diagnoses:  Abrasion     Discharge Instructions      Refrain from applying any more ointment to your wound and allow the scab to form.  Keep the wound covered with a nonadherent dressing when out in public and open to air at home to  encourage scab formation.  If you develop any swelling at the wound site, heat at the wound site, increase in drainage, red streaks going up your arm, or fever please return for reevaluation.     ED Prescriptions   None    PDMP not reviewed this encounter.   Margarette Canada, NP 06/27/21 [phone]

## 2021-07-04 ENCOUNTER — Encounter: Payer: Self-pay | Admitting: General Surgery

## 2021-08-16 ENCOUNTER — Encounter: Payer: Self-pay | Admitting: General Surgery

## 2021-09-26 ENCOUNTER — Encounter: Payer: Self-pay | Admitting: Internal Medicine

## 2021-10-04 IMAGING — CT CT ABDOMEN W/ CM
1 of 3 series · 13 of 32 positions shown, 19 images · IV contrast (APPLIED)
Comparison: No priors.

CLINICAL DATA: 74-year-old female with history of dilatation of the
distal common bile duct noted on prior abdominal ultrasound.
Follow-up study.

EXAM:
CT ABDOMEN WITH CONTRAST
TECHNIQUE: Multidetector CT imaging of the abdomen was performed using the
standard protocol following bolus administration of intravenous
contrast.
CONTRAST:  75mL OMNIPAQUE IOHEXOL 300 MG/ML  SOLN

[Series 2: axial st · axial · 0.57mm/px · z∈[-833,-663]mm · 13 of 40 slices shown, 19 images]
[im 3/40  soft-tissue]
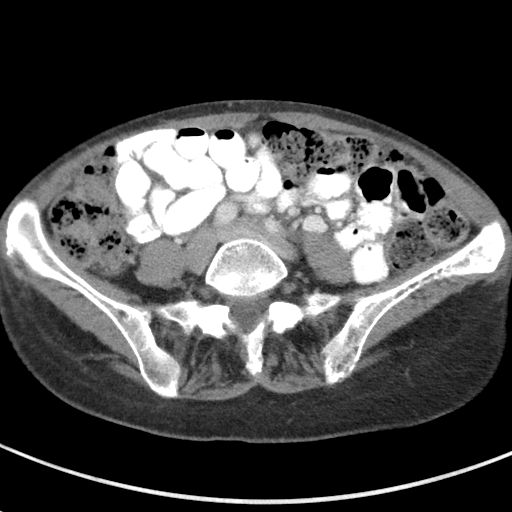
[im 3/40  bone]
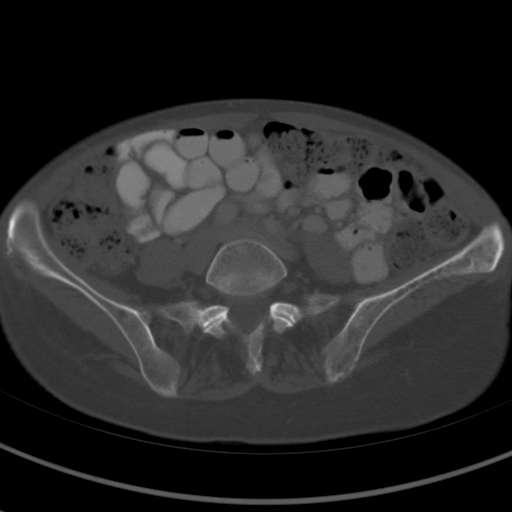
[im 6/40  soft-tissue]
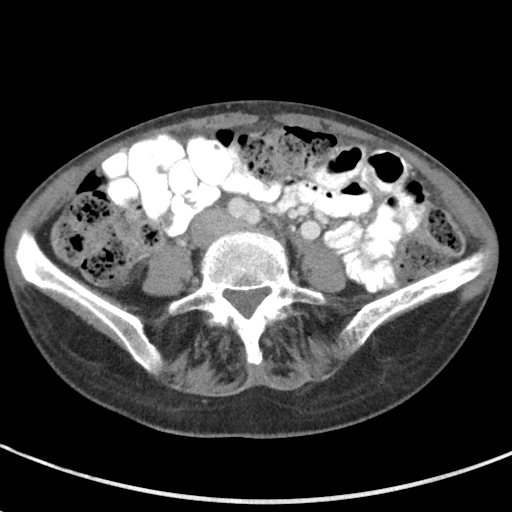
[im 9/40  soft-tissue]
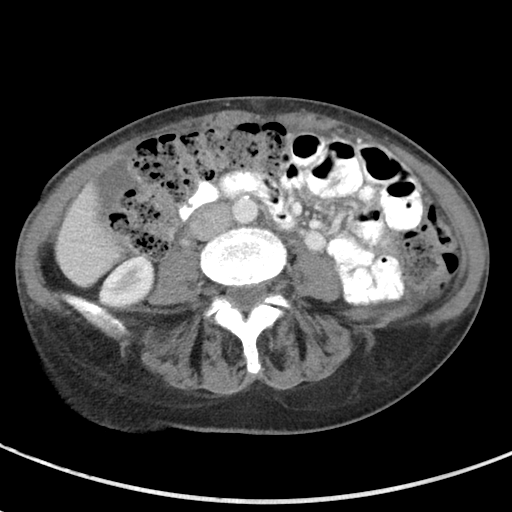
[im 12/40  soft-tissue]
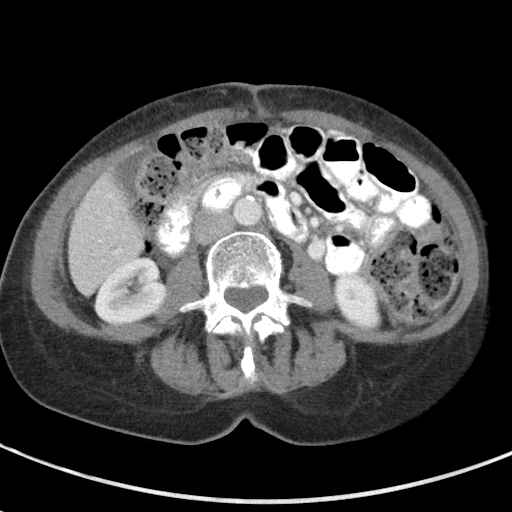
[im 14/40  soft-tissue]
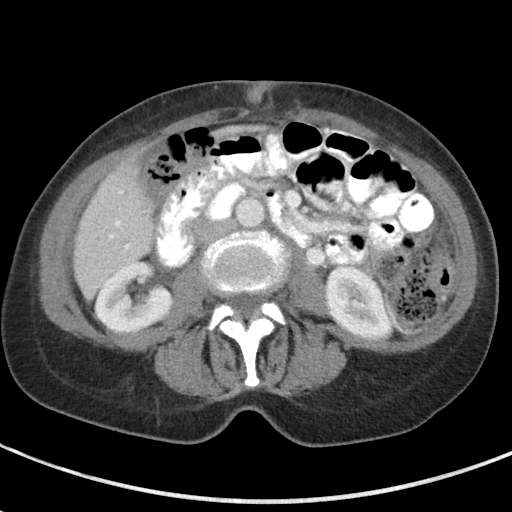
[im 17/40  soft-tissue]
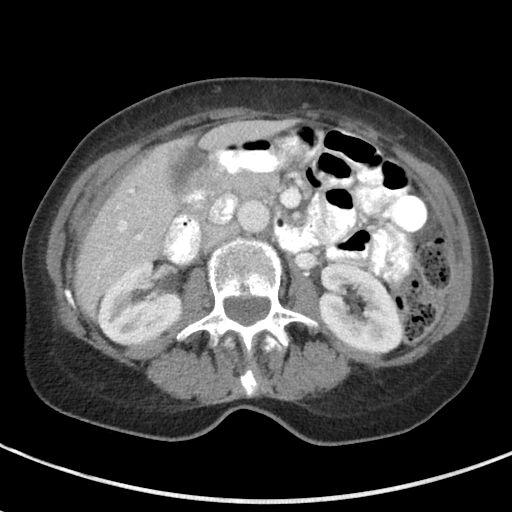
[im 20/40  soft-tissue]
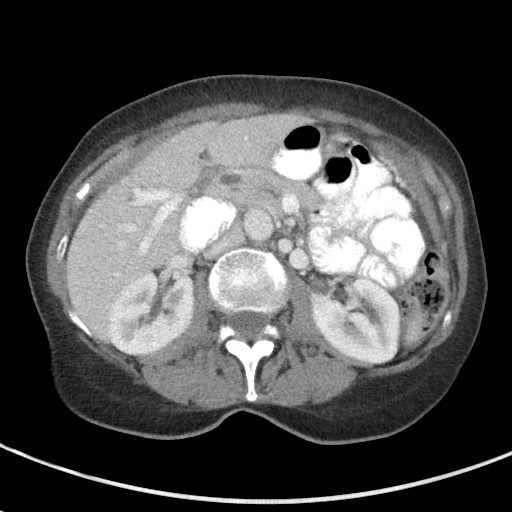
[im 23/40  soft-tissue]
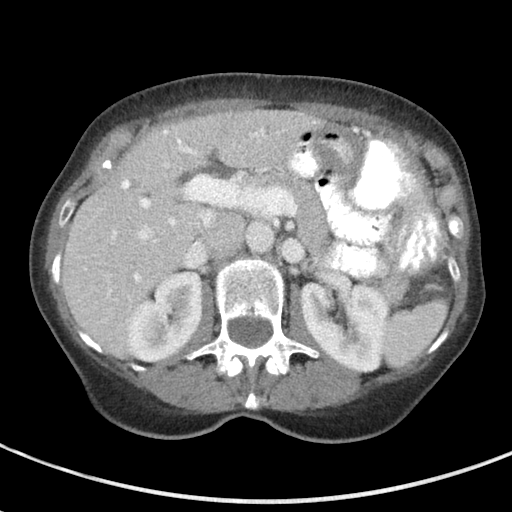
[im 26/40  soft-tissue]
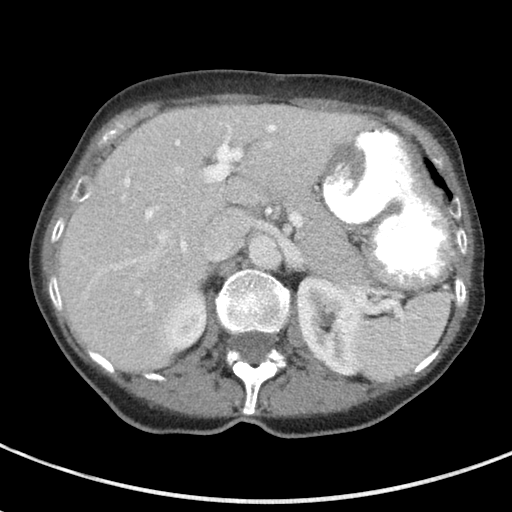
[im 26/40  bone]
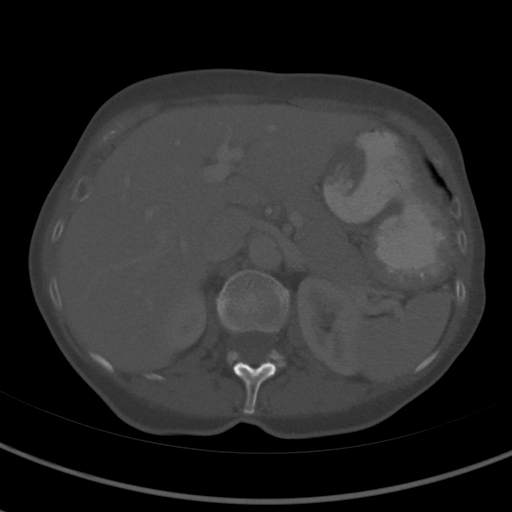
[im 28/40  soft-tissue]
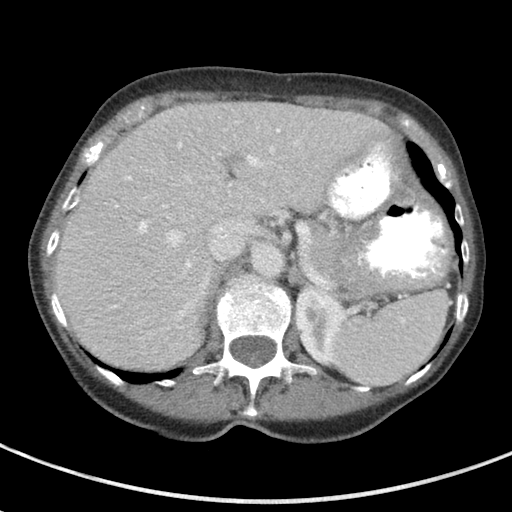
[im 28/40  lung]
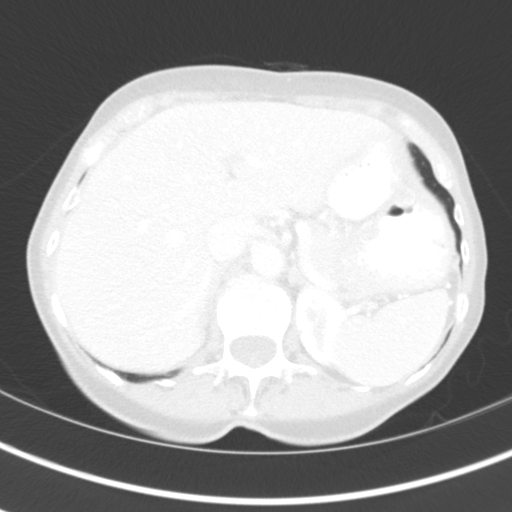
[im 31/40  soft-tissue]
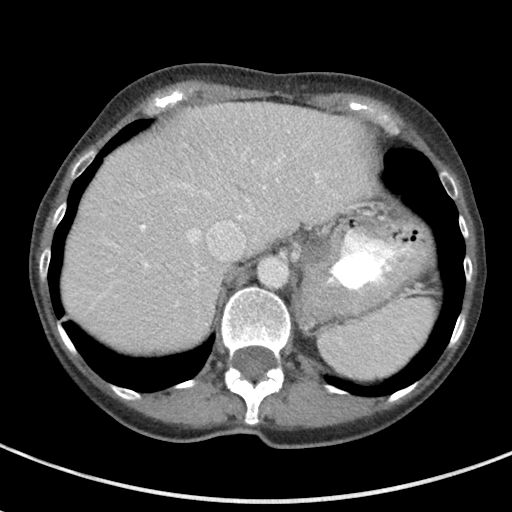
[im 31/40  lung]
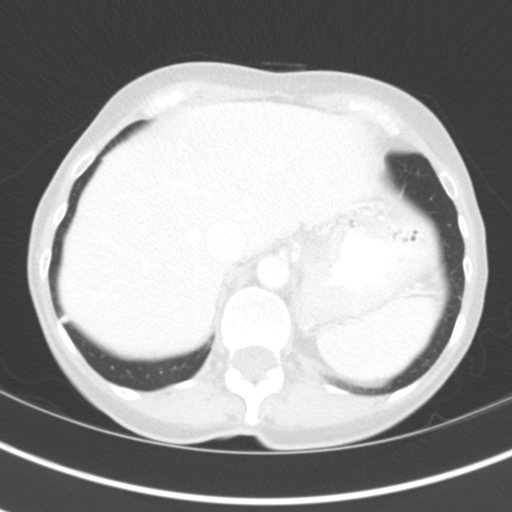
[im 34/40  soft-tissue]
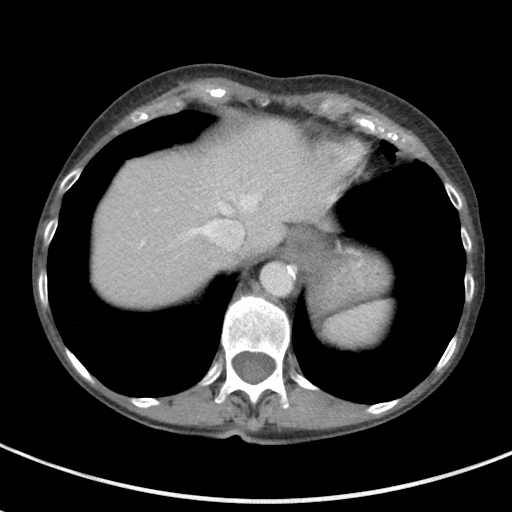
[im 34/40  lung]
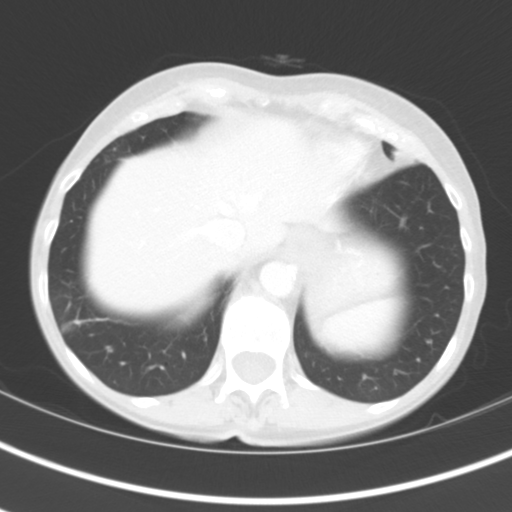
[im 37/40  soft-tissue]
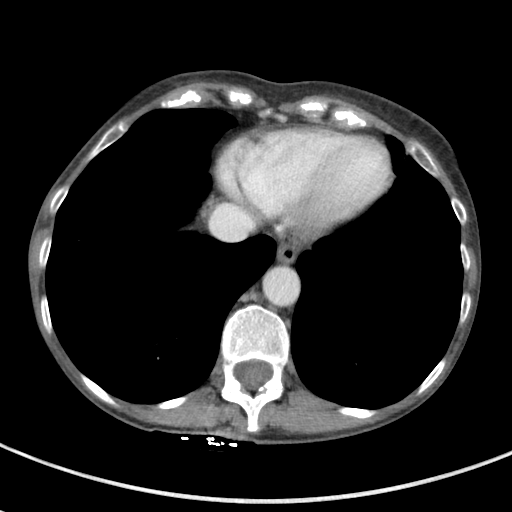
[im 37/40  lung]
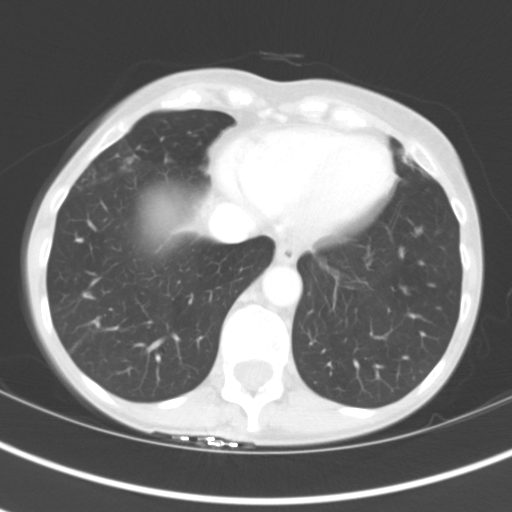

[13 of 32 positions shown; findings below may reference images not displayed]

FINDINGS: Lower chest: Mild scarring in the lung bases.

Hepatobiliary: No suspicious cystic or solid hepatic lesions. No
intra or extrahepatic biliary ductal dilatation. Common bile duct
measures 5-6 mm distally. Gallbladder is normal in appearance.

Pancreas: No pancreatic mass. No pancreatic ductal dilatation. No
pancreatic or peripancreatic fluid collections or inflammatory
changes.

Spleen: Unremarkable.

Adrenals/Urinary Tract: Bilateral kidneys and bilateral adrenal
glands are normal in appearance. No hydroureteronephrosis in the
visualized portions of the abdomen.

Stomach/Bowel: Normal appearance of the stomach. No pathologic
dilatation of visualized portions of small bowel or colon.

Vascular/Lymphatic: Aortic atherosclerosis, without evidence of
aneurysm or dissection in the abdominal vasculature. No
lymphadenopathy noted in the abdomen.

Other: No significant volume of ascites and no pneumoperitoneum
noted in the visualized portions of the peritoneal cavity.

Musculoskeletal: No aggressive appearing lytic or blastic lesions
noted in the visualized portions of the skeleton.
IMPRESSION: 1. No evidence of intra or extrahepatic biliary ductal dilatation.
Distal common bile duct is normal in caliber measuring from 5-6 mm.
2. No acute findings in the abdomen.
3. Aortic atherosclerosis.

## 2021-11-19 ENCOUNTER — Ambulatory Visit (INDEPENDENT_AMBULATORY_CARE_PROVIDER_SITE_OTHER): Payer: Medicare HMO

## 2021-11-19 ENCOUNTER — Telehealth: Payer: Self-pay

## 2021-11-19 VITALS — Ht 63.0 in | Wt 109.0 lb

## 2021-11-19 DIAGNOSIS — Z0000 Encounter for general adult medical examination without abnormal findings: Secondary | ICD-10-CM

## 2021-11-19 NOTE — Progress Notes (Signed)
Subjective:   Mary Everett is a 76 y.o. female who presents for Medicare Annual (Subsequent) preventive examination.  Review of Systems    No ROS.  Medicare Wellness Virtual Visit.  Visual/audio telehealth visit, UTA vital signs.   See social history for additional risk factors.   Cardiac Risk Factors include: advanced age (>4men, >66 women)     Objective:    Today's Vitals   11/19/21 1306  Weight: 109 lb (49.4 kg)  Height: 5\' 3"  (1.6 m)   Body mass index is 19.31 kg/m.  Advanced Directives 11/19/2021 06/11/2021 05/23/2021 05/16/2021 12/19/2020 09/12/2020 05/09/2020  Does Patient Have a Medical Advance Directive? No Yes Yes Yes Yes No No  Type of Advance Directive - [place];Living will [place];Living will [place];Living will [place];Living will - -  Does patient want to make changes to medical advance directive? No - Patient declined No - Patient declined No - Patient declined - No - Patient declined - -  Copy of [place] in Chart? - No - copy requested No - copy requested No - copy requested Yes - validated most recent copy scanned in chart (See row information) - -  Would patient like information on creating a medical advance directive? - - - - - No - Patient declined No - Patient declined    Current Medications (verified) Outpatient Encounter Medications as of 11/19/2021  Medication Sig   Biotin 5 MG TABS Take 10 mg by mouth daily.   Ca Cit Malate-Cholecalciferol (CALCIUM CITRATE MALATE-VIT D PO) Take 3 tablets by mouth in the morning and at bedtime.   Cod Liver Oil CAPS Take 1 capsule by mouth daily.   folic acid (FOLVITE) 283 MCG tablet Take 800 mcg by mouth daily.   ibuprofen (ADVIL) 800 MG tablet Take 1 tablet (800 mg total) by mouth every 8 (eight) hours as needed.   Magnesium Oxide 420 (252 Mg) MG TABS Take 420 mg by mouth every evening.   Multiple Vitamins-Minerals (PX  COMPLETE SENIOR MULTIVITS) TABS Take 1 tablet by mouth daily.   Multiple Vitamins-Minerals (ZINC PO) Take 30 mg by mouth daily.   Probiotic Product (PROBIOTIC DAILY PO) Take 1 capsule by mouth daily.   No facility-administered encounter medications on file as of 11/19/2021.    Allergies (verified) Sulfa antibiotics   History: Past Medical History:  Diagnosis Date   Cataract    Hypoglycemia    Osteoporosis 11/21/2017   Tubal pregnancy    Past Surgical History:  Procedure Laterality Date   CATARACT EXTRACTION W/ INTRAOCULAR LENS IMPLANT Bilateral    COLONOSCOPY WITH PROPOFOL N/A 12/19/2020   Procedure: COLONOSCOPY WITH PROPOFOL;  Surgeon: [name], MD;  Location: [place];  Service: Endoscopy;  Laterality: N/A;  requests early priority 4   ECTOPIC PREGNANCY SURGERY     POLYPECTOMY N/A 12/19/2020   Procedure: POLYPECTOMY;  Surgeon: [name], MD;  Location: [place];  Service: Endoscopy;  Laterality: N/A;   Family History  Problem Relation Age of Onset   Cancer Mother 1       lung   Cancer Father 14       brain   Memory loss Sister    Social History   Socioeconomic History   Marital status: Married    Spouse name: Not on file   Number of children: Not on file   Years of education: Not on file   Highest  education level: Not on file  Occupational History   Not on file  Tobacco Use   Smoking status: Former    Types: Cigarettes    Quit date: 10/17/1972    Years since quitting: 49.1   Smokeless tobacco: Never  Vaping Use   Vaping Use: Never used  Substance and Sexual Activity   Alcohol use: No   Drug use: No   Sexual activity: Not on file  Other Topics Concern   Not on file  Social History Narrative   In [place]; with husband; 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    Social Determinants of Health   Financial Resource Strain: Not on file  Food Insecurity: Not on file   Transportation Needs: Not on file  Physical Activity: Not on file  Stress: Not on file  Social Connections: Not on file    Tobacco Counseling Counseling given: Not Answered   Clinical Intake:  Pre-visit preparation completed: Yes        Diabetes: No  How often do you need to have someone help you when you read instructions, pamphlets, or other written materials from your doctor or pharmacy?: 1 - Never    Interpreter Needed?: No      Activities of Daily Living In your present state of health, do you have any difficulty performing the following activities: 11/19/2021 05/16/2021  Hearing? N N  Vision? N N  Difficulty concentrating or making decisions? N N  Walking or climbing stairs? N N  Dressing or bathing? N N  Doing errands, shopping? N N  Preparing Food and eating ? N -  Using the Toilet? N -  In the past six months, have you accidently leaked urine? N -  Do you have problems with loss of bowel control? N -  Managing your Medications? N -  Managing your Finances? N -  Housekeeping or managing your Housekeeping? N -  Some recent data might be hidden    Patient Care Team: Einar Pheasant, MD as PCP - General (Internal Medicine)  Indicate any recent Medical Services you may have received from other than [facility] providers in the past year (date may be approximate).     Assessment:   This is a routine wellness examination for Mary Everett.  Virtual Visit via Telephone Note  I connected with  Mary Everett on 11/19/21 at 12:30 PM EST by telephone and verified that I am speaking with the correct person using two identifiers.  Persons participating in the virtual visit: patient/Nurse Health Advisor   I discussed the limitations, risks, security and privacy concerns of performing an evaluation and management service by telephone and the availability of in person appointments. The patient expressed understanding and agreed to proceed.  Interactive audio and video  telecommunications were attempted between this nurse and patient, however failed, due to patient having technical difficulties OR patient did not have access to video capability.  We continued and completed visit with audio only.  Some vital signs may be absent or patient reported.   Hearing/Vision screen Hearing Screening - Comments:: Patient is able to hear conversational tones without difficulty.  No issues reported.  Followed by Visits every 4 months  Hearing aid, bilateral  Vision Screening - Comments:: Wears corrective lenses Cataract extraction, bilateral They have regular follow up with their ophthalmologist.  Dietary issues and exercise activities discussed: Current Exercise Habits: Home exercise routine, Type of exercise: walking;treadmill;strength training/weights;stretching, Time (Minutes): > 60, Frequency (Times/Week): 5, Weekly Exercise (  Minutes/Week): 0, Intensity: Moderate Low carb diet Good water intake    Goals Addressed               This Visit's Progress     Patient Stated     Increase brain engagement activities (pt-stated)        I want to start playing the clarinet again       Depression Screen PHQ 2/9 Scores 11/19/2021 11/16/2020 12/30/2019 11/16/2019 08/09/2019  PHQ - 2 Score 0 0 0 0 0    Fall Risk Fall Risk  11/19/2021 05/10/2021 11/16/2020 03/02/2020 12/30/2019  Falls in the past year? 0 0 0 0 0  Number falls in past yr: 0 - 0 - 0  Injury with Fall? - - 0 - 0  Follow up Falls evaluation completed - Falls evaluation completed - Falls evaluation completed    [place]: Home free of loose throw rugs in walkways, pet beds, electrical cords, etc? Yes  Adequate lighting in your home to reduce risk of falls? Yes   ASSISTIVE DEVICES UTILIZED TO PREVENT FALLS: Life alert? No  Use of a cane, walker or w/c? No   TIMED UP AND GO: Was the test performed? No .   Cognitive Function:  Patient is alert and oriented x3.   Enjoys reading aloud, sudoku, crossword puzzles.    6CIT Screen 11/16/2019  What Year? 0 points  What month? 0 points  What time? 0 points    Immunizations Immunization History  Administered Date(s) Administered   Influenza, High Dose Seasonal PF 08/25/2018   Influenza,inj,Quad PF,6+ Mos 07/12/2019   Influenza-Unspecified 08/07/2020, 09/13/2021   Moderna SARS-COV2 Booster Vaccination 09/13/2021   PFIZER(Purple Top)SARS-COV-2 Vaccination 12/23/2019, 01/19/2020, 08/11/2020   Pneumococcal Conjugate-13 01/18/2021   Pneumococcal Polysaccharide-23 07/12/2019   Td 05/21/2016   Zoster Recombinat (Shingrix) 02/07/2021, 04/20/2021   Screening Tests Health Maintenance  Topic Date Due   COVID-19 Vaccine (4 - Booster for [place] series) 12/05/2021 (Originally 11/08/2021)   MAMMOGRAM  01/08/2022   TETANUS/TDAP  05/21/2026   COLONOSCOPY (Pts 45-69yrs Insurance coverage will need to be confirmed)  12/20/2027   Pneumonia Vaccine 39+ Years old  Completed   INFLUENZA VACCINE  Completed   DEXA SCAN  Completed   Hepatitis C Screening  Completed   Zoster Vaccines- Shingrix  Completed   HPV VACCINES  Aged Out   Health Maintenance There are no preventive care reminders to display for this patient.  Mammogram- Agrees to call [facility] to schedule.   Lung Cancer Screening: (Low Dose CT Chest recommended if Age 50-80 years, 30 pack-year currently smoking OR have quit w/in 15years.) does not qualify.   Vision Screening: Recommended annual ophthalmology exams for early detection of glaucoma and other disorders of the eye.  Dental Screening: Recommended annual dental exams for proper oral hygiene  Community Resource Referral / Chronic Care Management: CRR required this visit?  No   CCM required this visit?  No      Plan:   Keep all routine maintenance appointments.   I have personally reviewed and noted the following in the patients chart:   Medical and social history Use of alcohol, tobacco  or illicit drugs  Current medications and supplements including opioid prescriptions. Not taking opioid.  Functional ability and status Nutritional status Physical activity Advanced directives List of other physicians Hospitalizations, surgeries, and ER visits in previous 12 months Vitals Screenings to include cognitive, depression, and falls Referrals and appointments  In addition, I  have reviewed and discussed with patient certain preventive protocols, quality metrics, and best practice recommendations. A written personalized care plan for preventive services as well as general preventive health recommendations were provided to patient.     Varney Biles, LPN   6/77/0340

## 2021-11-19 NOTE — Telephone Encounter (Signed)
No answer when called for scheduled AWV. Left message to call the office and reschedule.

## 2021-11-19 NOTE — Patient Instructions (Addendum)
Mary Everett , Thank you for taking time to come for your Medicare Wellness Visit. I appreciate your ongoing commitment to your health goals. Please review the following plan we discussed and let me know if I can assist you in the future.   These are the goals we discussed:  Goals       Patient Stated     Increase brain engagement activities (pt-stated)      I want to start playing the clarinet again        This is a list of the screening recommended for you and due dates:  Health Maintenance  Topic Date Due   COVID-19 Vaccine (4 - Booster for Pfizer series) 12/05/2021*   Mammogram  01/08/2022   Tetanus Vaccine  05/21/2026   Colon Cancer Screening  12/20/2027   Pneumonia Vaccine  Completed   Flu Shot  Completed   DEXA scan (bone density measurement)  Completed   Hepatitis C Screening: USPSTF Recommendation to screen - Ages 27-79 yo.  Completed   Zoster (Shingles) Vaccine  Completed   HPV Vaccine  Aged Out  *Topic was postponed. The date shown is not the original due date.    Advanced directives: not yet completed. Plans to schedule follow up with pcp for discussion with Advanced Directives.   Follow up in one year for your annual wellness visit    Preventive Care 65 Years and Older, Female Preventive care refers to lifestyle choices and visits with your health care provider that can promote health and wellness. What does preventive care include? A yearly physical exam. This is also called an annual well check. Dental exams once or twice a year. Routine eye exams. Ask your health care provider how often you should have your eyes checked. Personal lifestyle choices, including: Daily care of your teeth and gums. Regular physical activity. Eating a healthy diet. Avoiding tobacco and drug use. Limiting alcohol use. Practicing safe sex. Taking low-dose aspirin every day. Taking vitamin and mineral supplements as recommended by your health care provider. What happens during  an annual well check? The services and screenings done by your health care provider during your annual well check will depend on your age, overall health, lifestyle risk factors, and family history of disease. Counseling  Your health care provider may ask you questions about your: Alcohol use. Tobacco use. Drug use. Emotional well-being. Home and relationship well-being. Sexual activity. Eating habits. History of falls. Memory and ability to understand (cognition). Work and work Statistician. Reproductive health. Screening  You may have the following tests or measurements: Height, weight, and BMI. Blood pressure. Lipid and cholesterol levels. These may be checked every 5 years, or more frequently if you are over 34 years old. Skin check. Lung cancer screening. You may have this screening every year starting at age 23 if you have a 30-pack-year history of smoking and currently smoke or have quit within the past 15 years. Fecal occult blood test (FOBT) of the stool. You may have this test every year starting at age 55. Flexible sigmoidoscopy or colonoscopy. You may have a sigmoidoscopy every 5 years or a colonoscopy every 10 years starting at age 70. Hepatitis C blood test. Hepatitis B blood test. Sexually transmitted disease (STD) testing. Diabetes screening. This is done by checking your blood sugar (glucose) after you have not eaten for a while (fasting). You may have this done every 1-3 years. Bone density scan. This is done to screen for osteoporosis. You may have this done starting  at age 83. Mammogram. This may be done every 1-2 years. Talk to your health care provider about how often you should have regular mammograms. Talk with your health care provider about your test results, treatment options, and if necessary, the need for more tests. Vaccines  Your health care provider may recommend certain vaccines, such as: Influenza vaccine. This is recommended every year. Tetanus,  diphtheria, and acellular pertussis (Tdap, Td) vaccine. You may need a Td booster every 10 years. Zoster vaccine. You may need this after age 55. Pneumococcal 13-valent conjugate (PCV13) vaccine. One dose is recommended after age 66. Pneumococcal polysaccharide (PPSV23) vaccine. One dose is recommended after age 2. Talk to your health care provider about which screenings and vaccines you need and how often you need them. This information is not intended to replace advice given to you by your health care provider. Make sure you discuss any questions you have with your health care provider. Document Released: 11/10/2015 Document Revised: 07/03/2016 Document Reviewed: 08/15/2015 Elsevier Interactive Patient Education  2017 [place] Prevention in the Home Falls can cause injuries. They can happen to people of all ages. There are many things you can do to make your home safe and to help prevent falls. What can I do on the outside of my home? Regularly fix the edges of walkways and driveways and fix any cracks. Remove anything that might make you trip as you walk through a door, such as a raised step or threshold. Trim any bushes or trees on the path to your home. Use bright outdoor lighting. Clear any walking paths of anything that might make someone trip, such as rocks or tools. Regularly check to see if handrails are loose or broken. Make sure that both sides of any steps have handrails. Any raised decks and porches should have guardrails on the edges. Have any leaves, snow, or ice cleared regularly. Use sand or salt on walking paths during winter. Clean up any spills in your garage right away. This includes oil or grease spills. What can I do in the bathroom? Use night lights. Install grab bars by the toilet and in the tub and shower. Do not use towel bars as grab bars. Use non-skid mats or decals in the tub or shower. If you need to sit down in the shower, use a plastic,  non-slip stool. Keep the floor dry. Clean up any water that spills on the floor as soon as it happens. Remove soap buildup in the tub or shower regularly. Attach bath mats securely with double-sided non-slip rug tape. Do not have throw rugs and other things on the floor that can make you trip. What can I do in the bedroom? Use night lights. Make sure that you have a light by your bed that is easy to reach. Do not use any sheets or blankets that are too big for your bed. They should not hang down onto the floor. Have a firm chair that has side arms. You can use this for support while you get dressed. Do not have throw rugs and other things on the floor that can make you trip. What can I do in the kitchen? Clean up any spills right away. Avoid walking on wet floors. Keep items that you use a lot in easy-to-reach places. If you need to reach something above you, use a strong step stool that has a grab bar. Keep electrical cords out of the way. Do not use floor polish or wax that makes  floors slippery. If you must use wax, use non-skid floor wax. Do not have throw rugs and other things on the floor that can make you trip. What can I do with my stairs? Do not leave any items on the stairs. Make sure that there are handrails on both sides of the stairs and use them. Fix handrails that are broken or loose. Make sure that handrails are as long as the stairways. Check any carpeting to make sure that it is firmly attached to the stairs. Fix any carpet that is loose or worn. Avoid having throw rugs at the top or bottom of the stairs. If you do have throw rugs, attach them to the floor with carpet tape. Make sure that you have a light switch at the top of the stairs and the bottom of the stairs. If you do not have them, ask someone to add them for you. What else can I do to help prevent falls? Wear shoes that: Do not have high heels. Have rubber bottoms. Are comfortable and fit you well. Are closed  at the toe. Do not wear sandals. If you use a stepladder: Make sure that it is fully opened. Do not climb a closed stepladder. Make sure that both sides of the stepladder are locked into place. Ask someone to hold it for you, if possible. Clearly mark and make sure that you can see: Any grab bars or handrails. First and last steps. Where the edge of each step is. Use tools that help you move around (mobility aids) if they are needed. These include: Canes. Walkers. Scooters. Crutches. Turn on the lights when you go into a dark area. Replace any light bulbs as soon as they burn out. Set up your furniture so you have a clear path. Avoid moving your furniture around. If any of your floors are uneven, fix them. If there are any pets around you, be aware of where they are. Review your medicines with your doctor. Some medicines can make you feel dizzy. This can increase your chance of falling. Ask your doctor what other things that you can do to help prevent falls. This information is not intended to replace advice given to you by your health care provider. Make sure you discuss any questions you have with your health care provider. Document Released: 08/10/2009 Document Revised: 03/21/2016 Document Reviewed: 11/18/2014 Elsevier Interactive Patient Education  2017 [employer].

## 2021-11-26 ENCOUNTER — Telehealth: Payer: Self-pay | Admitting: Internal Medicine

## 2021-11-26 ENCOUNTER — Ambulatory Visit
Admission: RE | Admit: 2021-11-26 | Discharge: 2021-11-26 | Disposition: A | Discharge status: Home / Self Care | Payer: Medicare HMO | Source: Ambulatory Visit | Attending: Emergency Medicine | Admitting: Emergency Medicine

## 2021-11-26 ENCOUNTER — Other Ambulatory Visit: Payer: Self-pay

## 2021-11-26 VITALS — BP 103/54 | HR 88 | Temp 99.3°F | Resp 16

## 2021-11-26 DIAGNOSIS — U071 COVID-19: Secondary | ICD-10-CM

## 2021-11-26 DIAGNOSIS — R051 Acute cough: Secondary | ICD-10-CM

## 2021-11-26 MED ORDER — BENZONATATE 100 MG PO CAPS: 100.0000 mg | ORAL_CAPSULE | Freq: Three times a day (TID) | ORAL | 0 refills | Status: DC

## 2021-11-26 NOTE — Telephone Encounter (Signed)
Called patient to follow up from UC visit. Patient stated that lungs were clear and was given tessalon perles for cough. Advised to call back and let us know if she does not continue to feel better. Pt agreed.

## 2021-11-26 NOTE — Telephone Encounter (Signed)
Pt called in stating that she tested positive yesterday for Covid. Pt stated she is concerned about her cough. Pt stated that her cough is not getting better. Pt stated that she feel still doesn't feel well. Pt stated that she schedule an appt at [facility] clinic to get her lungs check. Pt was wondering if the steps that she is taking are correct. Pt requesting callback.

## 2021-11-26 NOTE — ED Triage Notes (Signed)
PT reports she was covid positive yesterday with cough and chest congestion. Symptoms started Saturday evening.

## 2021-11-26 NOTE — Discharge Instructions (Signed)
Your home COVID test was positive, please quarantine, you may return to normal activity on Thursday, November 29, 2021  May use Tessalon pill every 8 hours to help calm coughing, please look up medicine on good Rx for coupon  You may use over-the-counter Delsym in addition to help calm coughing  Ensure that your upper airway stay moisten by sitting in a steam shower, increasing your fluid intake, teaspoons of honey and ensuring that your bedroom is not stuffy at nighttime  May attempt any of the following below in addition    You can take Tylenol and/or Ibuprofen as needed for fever reduction and pain relief.   For cough: honey 1/2 to 1 teaspoon (you can dilute the honey in water or another fluid).  You can also use guaifenesin. You can use a humidifier for chest congestion and cough.  If you don't have a humidifier, you can sit in the bathroom with the hot shower running.      For sore throat: try warm salt water gargles, cepacol lozenges, throat spray, warm tea or water with lemon/honey, popsicles or ice, or OTC cold relief medicine for throat discomfort.  Please return urgent care if you begin to have signs of difficulty breathing   For congestion: take a daily anti-histamine like Zyrtec, Claritin, and a oral decongestant, such as pseudoephedrine.  You can also use Flonase 1-2 sprays in each nostril daily.   It is important to stay hydrated: drink plenty of fluids (water, gatorade/powerade/pedialyte, juices, or teas) to keep your throat moisturized and help further relieve irritation/discomfort.

## 2021-11-26 NOTE — ED Provider Notes (Signed)
[facility]    CSN: [id number] Arrival date & time: 11/26/21  1445      History   Chief Complaint Chief Complaint  Patient presents with   Covid Positive    HPI Mary Everett is a 76 y.o. female.   Patient presents with fever, nasal congestion, rhinorrhea, sore throat and nonproductive cough for 2 days.  Cough has worsened and interfered with sleep.  Tolerating food and liquids.  Home COVID test positive.  Has attempted use of over-the-counter ibuprofen which has been helpful with fever management.  Vaccinated and booster.  No known sick contact.  Past Medical History:  Diagnosis Date   Cataract    Hypoglycemia    Osteoporosis 11/21/2017   Tubal pregnancy     Patient Active Problem List   Diagnosis Date Noted   Special screening for malignant neoplasms, colon    Polyp of colon    Wrist fracture 07/09/2020   Acquired neutropenia ([place]) 01/13/2020   Healthcare maintenance 01/08/2020   Leukopenia 01/08/2020   Thrombocytopenia ([place]) 01/08/2020   Eyelid dermatitis, infectious 01/05/2020   Abscess of buttock 08/15/2019   Colon cancer screening 08/15/2019   Breast cancer screening 08/15/2019   Acute non-recurrent maxillary sinusitis 10/26/2018   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   Insect bite 05/19/2018   Osteoporosis 11/21/2017   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      Home  Medications    Prior to Admission medications   Medication Sig Start Date End Date Taking? Authorizing Provider  Biotin 5 MG TABS Take 10 mg by mouth daily.    [provider]  Ca Cit Malate-Cholecalciferol (CALCIUM CITRATE MALATE-VIT D PO) Take 3 tablets by mouth in the morning and at bedtime.    [provider]  [facility] Liver Oil CAPS Take 1 capsule by mouth daily.    [provider]  folic acid (FOLVITE) 254 MCG tablet Take 800 mcg by mouth daily.    [provider]  ibuprofen (ADVIL) 800 MG tablet Take 1 tablet (800 mg total) by mouth every 8 (eight) hours as needed. 05/23/21   Fredirick Maudlin, MD  Magnesium Oxide 420 (252 Mg) MG TABS Take 420 mg by mouth every evening.    [provider]  Multiple Vitamins-Minerals (PX COMPLETE SENIOR MULTIVITS) TABS Take 1 tablet by mouth daily.    [provider]  Multiple Vitamins-Minerals (ZINC PO) Take 30 mg by mouth daily.    [provider]  Probiotic Product (PROBIOTIC DAILY PO) Take 1 capsule by mouth daily.    [provider]    Family History Family History  Problem Relation Age of Onset   Cancer Mother 25       lung   Cancer Father 67       brain   Memory loss Sister     Social History Social History   Tobacco Use   Smoking status: Former  Types: Cigarettes    Quit date: 10/17/1972    Years since quitting: 49.1   Smokeless tobacco: Never  Vaping Use   Vaping Use: Never used  Substance Use Topics   Alcohol use: No   Drug use: No     Allergies   Sulfa antibiotics   Review of Systems Review of Systems  Constitutional:  Positive for fever. Negative for activity change, appetite change, chills, diaphoresis, fatigue and unexpected weight change.  HENT:  Positive for congestion, rhinorrhea and sore throat. Negative for dental problem, drooling, ear discharge, ear pain, facial swelling, hearing loss, mouth sores, nosebleeds, postnasal drip, sinus pressure,  sinus pain, sneezing, tinnitus, trouble swallowing and voice change.   Respiratory:  Positive for cough. Negative for apnea, choking, chest tightness, shortness of breath, wheezing and stridor.   Cardiovascular: Negative.   Gastrointestinal: Negative.   Skin: Negative.   Neurological: Negative.     Physical Exam Triage Vital Signs ED Triage Vitals  Enc Vitals Group     BP 11/26/21 1517 (!) 103/54     Pulse Rate 11/26/21 1517 88     Resp 11/26/21 1517 16     Temp 11/26/21 1517 99.3 F (37.4 C)     Temp Source 11/26/21 1517 Temporal     SpO2 11/26/21 1517 98 %     Weight --      Height --      Head Circumference --      Peak Flow --      Pain Score 11/26/21 1516 0     Pain Loc --      Pain Edu? --      Excl. in [place]? --    No data found.  Updated Vital Signs BP (!) 103/54    Pulse 88    Temp 99.3 F (37.4 C) (Temporal)    Resp 16    SpO2 98%   Visual Acuity Right Eye Distance:   Left Eye Distance:   Bilateral Distance:    Right Eye Near:   Left Eye Near:    Bilateral Near:     Physical Exam Constitutional:      Appearance: Normal appearance.  HENT:     Head: Normocephalic.     Right Ear: Tympanic membrane, ear canal and external ear normal.     Left Ear: Tympanic membrane, ear canal and external ear normal.     Nose: Congestion and rhinorrhea present.     Mouth/Throat:     Mouth: Mucous membranes are moist.     Pharynx: Oropharynx is clear.  Eyes:     Extraocular Movements: Extraocular movements intact.  Cardiovascular:     Rate and Rhythm: Normal rate and regular rhythm.     Pulses: Normal pulses.     Heart sounds: Normal heart sounds.  Pulmonary:     Effort: Pulmonary effort is normal.     Breath sounds: Normal breath sounds.  Musculoskeletal:     Cervical back: Normal range of motion and neck supple.  Neurological:     Mental Status: She is alert and oriented to person, place, and time. Mental status is at baseline.  Psychiatric:        Mood and  Affect: Mood normal.        Behavior: Behavior normal.     UC Treatments / Results  Labs (all labs ordered are listed, but only abnormal results are displayed) Labs Reviewed - No data to display  EKG   Radiology No results found.  Procedures Procedures (including critical care time)  Medications Ordered in UC Medications - No data to display  Initial Impression / Assessment and Plan / UC Course  I have reviewed the triage vital signs and the nursing notes.  Pertinent labs & imaging results that were available during my care of the patient were reviewed by me and considered in my medical decision making (see chart for details).  COVID-19 Acute cough  Vital signs are stable, patient is in no signs of distress, lungs are clear on exam, will defer imaging at this time, discussed with patient, patient declined use of antiviral medication and would like to manage symptoms along, Tessalon pill prescribed, it is not covered by patient's insurance, patient has a good Rx and will attempt to use coupon, recommend over-the-counter Delsym in addition, patient may attempt increasing fluid intake, teaspoons of honey, sitting in a steamed bathroom and use of humidifier for further management, may follow-up with urgent care PCP as needed for worsening symptoms Final Clinical Impressions(s) / UC Diagnoses   Final diagnoses:  None   Discharge Instructions   None    ED Prescriptions   None    PDMP not reviewed this encounter.   Hans Eden, NP 11/26/21 1542

## 2021-12-05 ENCOUNTER — Encounter: Payer: Self-pay | Admitting: Internal Medicine

## 2022-01-21 ENCOUNTER — Other Ambulatory Visit: Payer: Self-pay

## 2022-01-21 ENCOUNTER — Ambulatory Visit (INDEPENDENT_AMBULATORY_CARE_PROVIDER_SITE_OTHER): Payer: Medicare HMO | Admitting: Internal Medicine

## 2022-01-21 ENCOUNTER — Encounter: Payer: Self-pay | Admitting: Internal Medicine

## 2022-01-21 VITALS — BP 106/66 | HR 66 | Temp 98.6°F | Resp 14 | Ht 63.0 in | Wt 107.6 lb

## 2022-01-21 DIAGNOSIS — Z0000 Encounter for general adult medical examination without abnormal findings: Secondary | ICD-10-CM | POA: Diagnosis not present

## 2022-01-21 DIAGNOSIS — D72819 Decreased white blood cell count, unspecified: Secondary | ICD-10-CM

## 2022-01-21 DIAGNOSIS — D709 Neutropenia, unspecified: Secondary | ICD-10-CM | POA: Diagnosis not present

## 2022-01-21 DIAGNOSIS — M810 Age-related osteoporosis without current pathological fracture: Secondary | ICD-10-CM | POA: Diagnosis not present

## 2022-01-21 DIAGNOSIS — Z13220 Encounter for screening for lipoid disorders: Secondary | ICD-10-CM | POA: Diagnosis not present

## 2022-01-21 DIAGNOSIS — H938X9 Other specified disorders of ear, unspecified ear: Secondary | ICD-10-CM

## 2022-01-21 DIAGNOSIS — D696 Thrombocytopenia, unspecified: Secondary | ICD-10-CM

## 2022-01-21 NOTE — Progress Notes (Signed)
Patient ID: Mary Everett, female   DOB: 1946/01/02, 76 y.o.   MRN: [id number] ? ? ?Subjective:  ? ? Patient ID: Mary Everett, female    DOB: [birth year], 76 y.o.   MRN: [id number] ? ?This visit occurred during the SARS-CoV-2 public health emergency.  Safety protocols were in place, including screening questions prior to the visit, additional usage of staff PPE, and extensive cleaning of exam room while observing appropriate contact time as indicated for disinfecting solutions.  ? ?Patient here for her physical exam.  ? ?Chief Complaint  ?Patient presents with  ? Annual Exam  ?  Discuss about living will, states she can make future appt if needed. Crackling noise in R ear feels like fluid she states if may be due to her allergies.   ? .  ? ?HPI ?Here for her physical. Diagnosed with covid - 11/26/21. No residual problems.  No cough or congestion. No sob.  Previously saw Dr Rogue Bussing.  Platelet count improved.  She does report some right ear "crackling".  No pain.  No sinus congestion or drainage.  Does occasionally take a claritin for allergies.  No acid reflux or abdominal pain reported.  Bowels moving.  Scheduled for her mammogram in 01/2022.   ? ? ?Past Medical History:  ?Diagnosis Date  ? Cataract   ? Hypoglycemia   ? Osteoporosis 11/21/2017  ? Tubal pregnancy   ? ?Past Surgical History:  ?Procedure Laterality Date  ? CATARACT EXTRACTION W/ INTRAOCULAR LENS IMPLANT Bilateral   ? COLONOSCOPY WITH PROPOFOL N/A 12/19/2020  ? Procedure: COLONOSCOPY WITH PROPOFOL;  Surgeon: [name], MD;  Location: [place];  Service: Endoscopy;  Laterality: N/A;  requests early ?priority 4  ? ECTOPIC PREGNANCY SURGERY    ? POLYPECTOMY N/A 12/19/2020  ? Procedure: POLYPECTOMY;  Surgeon: [name], MD;  Location: [place];  Service: Endoscopy;  Laterality: N/A;  ? ?Family History  ?Problem Relation Age of Onset  ? Cancer Mother 60  ?     lung  ? Cancer Father 40  ?     brain  ? Memory loss Sister    ? ?Social History  ? ?Socioeconomic History  ? Marital status: Married  ?  Spouse name: Not on file  ? Number of children: Not on file  ? Years of education: Not on file  ? Highest education level: Not on file  ?Occupational History  ? Not on file  ?Tobacco Use  ? Smoking status: Former  ?  Types: Cigarettes  ?  Quit date: 10/17/1972  ?  Years since quitting: 49.3  ? Smokeless tobacco: Never  ?Vaping Use  ? Vaping Use: Never used  ?Substance and Sexual Activity  ? Alcohol use: No  ? Drug use: No  ? Sexual activity: Not on file  ?Other Topics Concern  ? Not on file  ?Social History Narrative  ? In [place]; with husband; 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   ? ?Social Determinants of Health  ? ?Financial Resource Strain: Not on file  ?Food Insecurity: Not on file  ?Transportation Needs: Not on file  ?Physical Activity: Not on file  ?Stress: Not on file  ?Social Connections: Not on file  ? ? ? ?Review of Systems  ?Constitutional:  Negative for appetite change and unexpected weight change.  ?HENT:  Negative for congestion and sinus pressure.   ?     Ear crackling as outlined.   ?Respiratory:  Negative  for cough, chest tightness and shortness of breath.   ?Cardiovascular:  Negative for chest pain, palpitations and leg swelling.  ?Gastrointestinal:  Negative for abdominal pain, diarrhea, nausea and vomiting.  ?Genitourinary:  Negative for difficulty urinating and dysuria.  ?Musculoskeletal:  Negative for joint swelling and myalgias.  ?Skin:  Negative for color change and rash.  ?Neurological:  Negative for dizziness, light-headedness and headaches.  ?Psychiatric/Behavioral:  Negative for agitation and dysphoric mood.   ? ?   ?Objective:  ?  ? ?BP 106/66 (BP Location: Left Arm, Patient Position: Sitting, Cuff Size: Normal)   Pulse 66   Temp 98.6 ?F (37 ?C) (Oral)   Resp 14   Ht '5\' 3"'$  (1.6 m)   Wt 107 lb 9.6 oz (48.8 kg)   SpO2 99%   BMI 19.06 kg/m?  ?Wt Readings  from Last 3 Encounters:  ?01/21/22 107 lb 9.6 oz (48.8 kg)  ?11/19/21 109 lb (49.4 kg)  ?06/11/21 109 lb (49.4 kg)  ? ? ?Physical Exam ?Vitals reviewed.  ?Constitutional:   ?   General: She is not in acute distress. ?   Appearance: Normal appearance. She is well-developed.  ?HENT:  ?   Head: Normocephalic and atraumatic.  ?   Right Ear: Tympanic membrane, ear canal and external ear normal.  ?   Left Ear: Tympanic membrane, ear canal and external ear normal.  ?Eyes:  ?   General: No scleral icterus.    ?   Right eye: No discharge.     ?   Left eye: No discharge.  ?   Conjunctiva/sclera: Conjunctivae normal.  ?Neck:  ?   Thyroid: No thyromegaly.  ?Cardiovascular:  ?   Rate and Rhythm: Normal rate and regular rhythm.  ?Pulmonary:  ?   Effort: No tachypnea, accessory muscle usage or respiratory distress.  ?   Breath sounds: Normal breath sounds. No decreased breath sounds or wheezing.  ?Chest:  ?Breasts: ?   Right: No inverted nipple, mass, nipple discharge or tenderness (no axillary adenopathy).  ?   Left: No inverted nipple, mass, nipple discharge or tenderness (no axilarry adenopathy).  ?Abdominal:  ?   General: Bowel sounds are normal.  ?   Palpations: Abdomen is soft.  ?   Tenderness: There is no abdominal tenderness.  ?Musculoskeletal:     ?   General: No swelling or tenderness.  ?   Cervical back: Neck supple. No tenderness.  ?Lymphadenopathy:  ?   Cervical: No cervical adenopathy.  ?Skin: ?   Findings: No erythema or rash.  ?Neurological:  ?   Mental Status: She is alert and oriented to person, place, and time.  ?Psychiatric:     ?   Mood and Affect: Mood normal.     ?   Behavior: Behavior normal.  ? ? ? ?Outpatient Encounter Medications as of 01/21/2022  ?Medication Sig  ? Biotin 5 MG TABS Take 10 mg by mouth daily.  ? Ca Cit Malate-Cholecalciferol (CALCIUM CITRATE MALATE-VIT D PO) Take 3 tablets by mouth in the morning and at bedtime.  ? Cod Liver Oil CAPS Take 1 capsule by mouth daily.  ? folic acid (FOLVITE)  720 MCG tablet Take 800 mcg by mouth daily.  ? ibuprofen (ADVIL) 800 MG tablet Take 1 tablet (800 mg total) by mouth every 8 (eight) hours as needed.  ? Magnesium Oxide 420 (252 Mg) MG TABS Take 420 mg by mouth every evening.  ? Multiple Vitamins-Minerals (PX COMPLETE SENIOR MULTIVITS) TABS Take 1 tablet by  mouth daily.  ? [DISCONTINUED] benzonatate (TESSALON) 100 MG capsule Take 1 capsule (100 mg total) by mouth every 8 (eight) hours. (Patient not taking: Reported on 01/21/2022)  ? [DISCONTINUED] Multiple Vitamins-Minerals (ZINC PO) Take 30 mg by mouth daily. (Patient not taking: Reported on 01/21/2022)  ? [DISCONTINUED] Probiotic Product (PROBIOTIC DAILY PO) Take 1 capsule by mouth daily. (Patient not taking: Reported on 01/21/2022)  ? ?No facility-administered encounter medications on file as of 01/21/2022.  ?  ? ?Lab Results  ?Component Value Date  ? WBC 4.7 06/11/2021  ? HGB 13.4 06/11/2021  ? HCT 40.6 06/11/2021  ? PLT 191 06/11/2021  ? GLUCOSE 98 06/11/2021  ? CHOL 228 (H) 05/30/2021  ? TRIG 49.0 05/30/2021  ? HDL 75.60 05/30/2021  ? LDLCALC 143 (H) 05/30/2021  ? ALT 25 06/11/2021  ? AST 27 06/11/2021  ? NA 138 06/11/2021  ? K 4.1 06/11/2021  ? CL 102 06/11/2021  ? CREATININE 0.50 06/11/2021  ? BUN 29 (H) 06/11/2021  ? CO2 30 06/11/2021  ? TSH 2.68 05/30/2021  ? HGBA1C 5.6 05/24/2012  ? ? ?   ?Assessment & Plan:  ? ?Problem List Items Addressed This Visit   ? ? Acquired neutropenia ([place])  ?  Followed by hematology.  Stable.  ?  ?  ? Crackling sound in ear  ?  Trial of nasacort nasal spray.  Follow.  Notify me if persistent or if symptoms progress.  ?  ?  ? Healthcare maintenance  ?  Physical today 01/21/22.  PAP 12/2016 negative with negative HPV.  Colonoscopy 11/2020 - tubular adenoma x 2.  Recommended f/u colonoscopy in 7 years.   ?  ?  ? Leukopenia  ?  Followed by hematology.  ?  ?  ? Osteoporosis  ?  Bone density - osteoporosis.  Have discussed calcium, vitamin D and weight bearing exercise.  have discussed  treatment options.  ?  ?  ? Thrombocytopenia ([place])  ? Relevant Orders  ? CBC with Differential/Platelet  ? Comprehensive metabolic panel  ? ?Other Visit Diagnoses   ? ? Routine general medical examination at a healt

## 2022-01-21 NOTE — Assessment & Plan Note (Signed)
Physical today 01/21/22.  PAP 12/2016 negative with negative HPV.  Colonoscopy 11/2020 - tubular adenoma x 2.  Recommended f/u colonoscopy in 7 years.   ?

## 2022-01-21 NOTE — Patient Instructions (Signed)
Nasacort nasal spray - 2 sprays each nostril one time per day.   

## 2022-01-27 ENCOUNTER — Encounter: Payer: Self-pay | Admitting: Internal Medicine

## 2022-01-27 DIAGNOSIS — H938X9 Other specified disorders of ear, unspecified ear: Secondary | ICD-10-CM | POA: Insufficient documentation

## 2022-01-27 NOTE — Assessment & Plan Note (Signed)
Trial of nasacort nasal spray.  Follow.  Notify me if persistent or if symptoms progress.  ?

## 2022-01-27 NOTE — Assessment & Plan Note (Signed)
Bone density - osteoporosis.  Have discussed calcium, vitamin D and weight bearing exercise.  have discussed treatment options.  ?

## 2022-01-27 NOTE — Assessment & Plan Note (Signed)
Followed by hematology 

## 2022-01-27 NOTE — Assessment & Plan Note (Signed)
Followed by hematology. Stable.  

## 2022-02-12 DIAGNOSIS — Z1231 Encounter for screening mammogram for malignant neoplasm of breast: Secondary | ICD-10-CM | POA: Diagnosis not present

## 2022-02-14 ENCOUNTER — Other Ambulatory Visit (INDEPENDENT_AMBULATORY_CARE_PROVIDER_SITE_OTHER): Payer: Medicare HMO

## 2022-02-14 DIAGNOSIS — Z13220 Encounter for screening for lipoid disorders: Secondary | ICD-10-CM

## 2022-02-14 DIAGNOSIS — D696 Thrombocytopenia, unspecified: Secondary | ICD-10-CM | POA: Diagnosis not present

## 2022-02-14 LAB — CBC WITH DIFFERENTIAL/PLATELET
Basophils Absolute: 0.1 10*3/uL (ref 0.0–0.1)
Basophils Relative: 1.7 % (ref 0.0–3.0)
Eosinophils Absolute: 0 10*3/uL (ref 0.0–0.7)
Eosinophils Relative: 0.7 % (ref 0.0–5.0)
HCT: 39.3 % (ref 36.0–46.0)
Hemoglobin: 12.9 g/dL (ref 12.0–15.0)
Lymphocytes Relative: 53.1 % — ABNORMAL HIGH (ref 12.0–46.0)
Lymphs Abs: 1.9 10*3/uL (ref 0.7–4.0)
MCHC: 32.9 g/dL (ref 30.0–36.0)
MCV: 85.3 fl (ref 78.0–100.0)
Monocytes Absolute: 0.7 10*3/uL (ref 0.1–1.0)
Monocytes Relative: 19.5 % — ABNORMAL HIGH (ref 3.0–12.0)
Neutro Abs: 0.9 10*3/uL — ABNORMAL LOW (ref 1.4–7.7)
Neutrophils Relative %: 25 % — ABNORMAL LOW (ref 43.0–77.0)
Platelets: 115 10*3/uL — ABNORMAL LOW (ref 150.0–400.0)
RBC: 4.61 Mil/uL (ref 3.87–5.11)
RDW: 15.3 % (ref 11.5–15.5)
WBC: 3.7 10*3/uL — ABNORMAL LOW (ref 4.0–10.5)

## 2022-02-14 LAB — LIPID PANEL
Cholesterol: 208 mg/dL — ABNORMAL HIGH (ref 0–200)
HDL: 82.6 mg/dL (ref >39.00)
LDL Cholesterol: 117 mg/dL — ABNORMAL HIGH (ref 0–99)
NonHDL: 124.98
Total CHOL/HDL Ratio: 3
Triglycerides: 40 mg/dL (ref 0.0–149.0)
VLDL: 8 mg/dL (ref 0.0–40.0)

## 2022-02-14 LAB — COMPREHENSIVE METABOLIC PANEL
ALT: 20 U/L (ref 0–35)
AST: 26 U/L (ref 0–37)
Albumin: 3.9 g/dL (ref 3.5–5.2)
Alkaline Phosphatase: 49 U/L (ref 39–117)
BUN: 23 mg/dL (ref 6–23)
CO2: 32 mEq/L (ref 19–32)
Calcium: 8.5 mg/dL (ref 8.4–10.5)
Chloride: 99 mEq/L (ref 96–112)
Creatinine, Ser: 0.55 mg/dL (ref 0.40–1.20)
GFR: 90.48 mL/min (ref >60.00)
Glucose, Bld: 81 mg/dL (ref 70–99)
Potassium: 3.8 mEq/L (ref 3.5–5.1)
Sodium: 135 mEq/L (ref 135–145)
Total Bilirubin: 0.5 mg/dL (ref 0.2–1.2)
Total Protein: 6.2 g/dL (ref 6.0–8.3)

## 2022-02-15 ENCOUNTER — Telehealth: Payer: Self-pay

## 2022-02-15 ENCOUNTER — Other Ambulatory Visit: Payer: Self-pay

## 2022-02-15 ENCOUNTER — Encounter: Payer: Self-pay | Admitting: Internal Medicine

## 2022-02-15 DIAGNOSIS — E7800 Pure hypercholesterolemia, unspecified: Secondary | ICD-10-CM

## 2022-02-15 DIAGNOSIS — D72819 Decreased white blood cell count, unspecified: Secondary | ICD-10-CM

## 2022-02-15 HISTORY — DX: Pure hypercholesterolemia, unspecified: E78.00

## 2022-02-15 NOTE — Telephone Encounter (Signed)
Lab results given to the patient . She did voice understanding. Her lab appointment has been scheduled for 02/26/22. She does not want to start a cholesterol medication due to the risk. She stated she will discuss that with Dr. Nicki Reaper at a later visit. ?

## 2022-02-15 NOTE — Telephone Encounter (Signed)
Lm for pt to cb ? ?Notify Mary Everett that her white blood cell count is slightly decreased.  Platelet count low.  Has seen hematology previously.  Given the decrease from last checks, I would like to recheck cbc soon to confirm stable.  Recheck cbc in the next 7-10 days.  Also, cholesterol has improved from last check.  Given calculated cholesterol risk, it is still recommended for her to be on a cholesterol medication.  Recommend starting crestor '10mg'$  q day.  Will need liver panel checked 6 weeks after starting medication.  Kidney function tests and liver function tests are wnl.  ?

## 2022-02-15 NOTE — Telephone Encounter (Signed)
Noted.  Please place the order for the cbc so that it will be in the system when she comes in for lab.  Thanks   ?

## 2022-02-15 NOTE — Telephone Encounter (Signed)
-----   Message from Einar Pheasant, MD sent at 02/15/2022  5:10 AM EDT ----- Notify Mary Everett that her white blood cell count is slightly decreased.  Platelet count low.  Has seen hematology previously.  Given the decrease from last checks, I would like to recheck cbc soon to confirm stable.  Recheck cbc in the next 7-10 days.  Also, cholesterol has improved from last check.  Given calculated cholesterol risk, it is still recommended for her to be on a cholesterol medication.  Recommend starting crestor '10mg'$  q day.  Will need liver panel checked 6 weeks after starting medication.  Kidney function tests and liver function tests are wnl.

## 2022-02-26 ENCOUNTER — Other Ambulatory Visit (INDEPENDENT_AMBULATORY_CARE_PROVIDER_SITE_OTHER): Payer: Medicare HMO

## 2022-02-26 DIAGNOSIS — D72819 Decreased white blood cell count, unspecified: Secondary | ICD-10-CM | POA: Diagnosis not present

## 2022-02-26 LAB — CBC WITH DIFFERENTIAL/PLATELET
Basophils Absolute: 0 10*3/uL (ref 0.0–0.1)
Basophils Relative: 0.8 % (ref 0.0–3.0)
Eosinophils Absolute: 0 10*3/uL (ref 0.0–0.7)
Eosinophils Relative: 0.4 % (ref 0.0–5.0)
HCT: 40.4 % (ref 36.0–46.0)
Hemoglobin: 13.1 g/dL (ref 12.0–15.0)
Lymphocytes Relative: 43 % (ref 12.0–46.0)
Lymphs Abs: 2 10*3/uL (ref 0.7–4.0)
MCHC: 32.5 g/dL (ref 30.0–36.0)
MCV: 86 fl (ref 78.0–100.0)
Monocytes Absolute: 1 10*3/uL (ref 0.1–1.0)
Monocytes Relative: 20.7 % — ABNORMAL HIGH (ref 3.0–12.0)
Neutro Abs: 1.7 10*3/uL (ref 1.4–7.7)
Neutrophils Relative %: 35.1 % — ABNORMAL LOW (ref 43.0–77.0)
Platelets: 131 10*3/uL — ABNORMAL LOW (ref 150.0–400.0)
RBC: 4.7 Mil/uL (ref 3.87–5.11)
RDW: 15.6 % — ABNORMAL HIGH (ref 11.5–15.5)
WBC: 4.7 10*3/uL (ref 4.0–10.5)

## 2022-03-01 ENCOUNTER — Encounter: Payer: Self-pay | Admitting: Internal Medicine

## 2022-03-19 DIAGNOSIS — H43813 Vitreous degeneration, bilateral: Secondary | ICD-10-CM | POA: Diagnosis not present

## 2022-03-20 ENCOUNTER — Ambulatory Visit (INDEPENDENT_AMBULATORY_CARE_PROVIDER_SITE_OTHER): Payer: Medicare HMO | Admitting: Internal Medicine

## 2022-03-20 DIAGNOSIS — Z1211 Encounter for screening for malignant neoplasm of colon: Secondary | ICD-10-CM

## 2022-03-20 DIAGNOSIS — Z1231 Encounter for screening mammogram for malignant neoplasm of breast: Secondary | ICD-10-CM | POA: Diagnosis not present

## 2022-03-20 DIAGNOSIS — Z0289 Encounter for other administrative examinations: Secondary | ICD-10-CM

## 2022-03-20 NOTE — Progress Notes (Signed)
Patient ID: Mary Everett, female   DOB: January 31, 1946, 76 y.o.   MRN: [id number]   Subjective:    Patient ID: Mary Everett, female    DOB: [birth year], 76 y.o.   MRN: [id number]   Patient here for work in appt. Marland Kitchen   HPI Work in to discuss questions regarding a living will.  She brought in forms - for living will.  Discussed questions regarding power of attorney and about life sustaining measures.  Went through specific questions on the form.  Overall she is doing well.  Stays active.  Breathing stable.  Eating.  No nausea or vomiting.  Discussed some allergy issues - peanuts.  Takes claritin prn.  Desires no further medication or intervention.    Past Medical History:  Diagnosis Date   Cataract    Hypercholesteremia 02/15/2022   Hypoglycemia    Osteoporosis 11/21/2017   Tubal pregnancy    Past Surgical History:  Procedure Laterality Date   CATARACT EXTRACTION W/ INTRAOCULAR LENS IMPLANT Bilateral    COLONOSCOPY WITH PROPOFOL N/A 12/19/2020   Procedure: COLONOSCOPY WITH PROPOFOL;  Surgeon: [name], MD;  Location: [place];  Service: Endoscopy;  Laterality: N/A;  requests early priority 4   ECTOPIC PREGNANCY SURGERY     POLYPECTOMY N/A 12/19/2020   Procedure: POLYPECTOMY;  Surgeon: [name], MD;  Location: [place];  Service: Endoscopy;  Laterality: N/A;   Family History  Problem Relation Age of Onset   Cancer Mother 30       lung   Cancer Father 67       brain   Memory loss Sister    Social History   Socioeconomic History   Marital status: Married    Spouse name: Not on file   Number of children: Not on file   Years of education: Not on file   Highest education level: Not on file  Occupational History   Not on file  Tobacco Use   Smoking status: Former    Types: Cigarettes    Quit date: 10/17/1972    Years since quitting: 49.4   Smokeless tobacco: Never  Vaping Use   Vaping Use: Never used  Substance and Sexual Activity    Alcohol use: No   Drug use: No   Sexual activity: Not on file  Other Topics Concern   Not on file  Social History Narrative   In [place]; with husband; 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    Social Determinants of Health   Financial Resource Strain: Not on file  Food Insecurity: Not on file  Transportation Needs: Not on file  Physical Activity: Not on file  Stress: Not on file  Social Connections: Not on file     Review of Systems  Constitutional:  Negative for appetite change and unexpected weight change.  HENT:  Negative for congestion and sinus pressure.        Occasional sneezing.   Respiratory:  Negative for cough, chest tightness and shortness of breath.   Cardiovascular:  Negative for chest pain, palpitations and leg swelling.  Gastrointestinal:  Negative for abdominal pain, nausea and vomiting.  Genitourinary:  Negative for difficulty urinating and dysuria.  Musculoskeletal:  Negative for joint swelling and myalgias.  Skin:  Negative for color change and rash.  Neurological:  Negative for dizziness and headaches.  Psychiatric/Behavioral:  Negative for agitation and dysphoric mood.       Objective:     BP  104/60 (BP Location: Left Arm, Patient Position: Sitting, Cuff Size: Small)   Pulse 64   Temp (!) 97.5 F (36.4 C) (Temporal)   Resp 15   Ht '5\' 3"'$  (1.6 m)   Wt 106 lb (48.1 kg)   SpO2 97%   BMI 18.78 kg/m  Wt Readings from Last 3 Encounters:  03/20/22 106 lb (48.1 kg)  01/21/22 107 lb 9.6 oz (48.8 kg)  11/19/21 109 lb (49.4 kg)    Physical Exam Constitutional:      General: She is not in acute distress.    Appearance: Normal appearance.  HENT:     Head: Normocephalic and atraumatic.  Skin:    Findings: No erythema or rash.  Neurological:     Mental Status: She is alert.  Psychiatric:        Mood and Affect: Mood normal.        Behavior: Behavior normal.  Breathing appears to be stable.   No  increased swelling.    Outpatient Encounter Medications as of 03/20/2022  Medication Sig   Biotin 5 MG TABS Take 10 mg by mouth daily.   Ca Cit Malate-Cholecalciferol (CALCIUM CITRATE MALATE-VIT D PO) Take 3 tablets by mouth in the morning and at bedtime.   Cod Liver Oil CAPS Take 1 capsule by mouth daily.   folic acid (FOLVITE) 673 MCG tablet Take 800 mcg by mouth daily.   ibuprofen (ADVIL) 800 MG tablet Take 1 tablet (800 mg total) by mouth every 8 (eight) hours as needed.   Magnesium Oxide 420 (252 Mg) MG TABS Take 420 mg by mouth every evening.   Multiple Vitamins-Minerals (PX COMPLETE SENIOR MULTIVITS) TABS Take 1 tablet by mouth daily.   No facility-administered encounter medications on file as of 03/20/2022.     Lab Results  Component Value Date   WBC 4.7 02/26/2022   HGB 13.1 02/26/2022   HCT 40.4 02/26/2022   PLT 131.0 (L) 02/26/2022   GLUCOSE 81 02/14/2022   CHOL 208 (H) 02/14/2022   TRIG 40.0 02/14/2022   HDL 82.60 02/14/2022   LDLCALC 117 (H) 02/14/2022   ALT 20 02/14/2022   AST 26 02/14/2022   NA 135 02/14/2022   K 3.8 02/14/2022   CL 99 02/14/2022   CREATININE 0.55 02/14/2022   BUN 23 02/14/2022   CO2 32 02/14/2022   TSH 2.68 05/30/2021   HGBA1C 5.6 05/24/2012       Assessment & Plan:   Problem List Items Addressed This Visit     Breast cancer screening    Mammogram 02/12/22 - [facility] I.        Colon cancer screening    Colonoscopy 12/19/20 -Two 5 to 6 mm polyps in the descending colon and in the ascending colon, removed with a cold snare. Resected and retrieved. The examination was otherwise normal. The rectum, sigmoid colon, descending colon, transverse colon, ascending colon and cecum are normal. The distal rectum and anal verge are normal on retroflexion view.        Encounter for completion of form with patient    Discussed living will and life sustaining measures.  Discussed form and questions regarding the form.           Einar Pheasant,  MD

## 2022-03-26 ENCOUNTER — Encounter: Payer: Self-pay | Admitting: Internal Medicine

## 2022-03-26 DIAGNOSIS — Z0289 Encounter for other administrative examinations: Secondary | ICD-10-CM | POA: Insufficient documentation

## 2022-03-26 NOTE — Assessment & Plan Note (Signed)
Colonoscopy 12/19/20 -Two 5 to 6 mm polyps in the descending colon and in the ascending colon, removed with a cold snare. Resected and retrieved. The examination was otherwise normal. The rectum, sigmoid colon, descending colon, transverse colon, ascending colon and cecum are normal. The distal rectum and anal verge are normal on retroflexion view.

## 2022-03-26 NOTE — Assessment & Plan Note (Signed)
Mammogram 02/12/22 - Birads I.

## 2022-03-26 NOTE — Assessment & Plan Note (Signed)
Discussed living will and life sustaining measures.  Discussed form and questions regarding the form.

## 2022-06-12 ENCOUNTER — Encounter: Payer: Self-pay | Admitting: Internal Medicine

## 2022-06-12 ENCOUNTER — Ambulatory Visit: Payer: Medicare HMO | Admitting: Family Medicine

## 2022-06-12 DIAGNOSIS — R778 Other specified abnormalities of plasma proteins: Secondary | ICD-10-CM | POA: Diagnosis not present

## 2022-06-12 DIAGNOSIS — Z882 Allergy status to sulfonamides status: Secondary | ICD-10-CM | POA: Diagnosis not present

## 2022-06-12 DIAGNOSIS — R5383 Other fatigue: Secondary | ICD-10-CM | POA: Diagnosis not present

## 2022-06-12 DIAGNOSIS — I209 Angina pectoris, unspecified: Secondary | ICD-10-CM | POA: Diagnosis not present

## 2022-06-12 DIAGNOSIS — Z8616 Personal history of COVID-19: Secondary | ICD-10-CM | POA: Diagnosis not present

## 2022-06-12 DIAGNOSIS — I208 Other forms of angina pectoris: Secondary | ICD-10-CM | POA: Diagnosis not present

## 2022-06-12 DIAGNOSIS — D696 Thrombocytopenia, unspecified: Secondary | ICD-10-CM | POA: Diagnosis not present

## 2022-06-12 DIAGNOSIS — M810 Age-related osteoporosis without current pathological fracture: Secondary | ICD-10-CM | POA: Diagnosis not present

## 2022-06-12 DIAGNOSIS — Z87891 Personal history of nicotine dependence: Secondary | ICD-10-CM | POA: Diagnosis not present

## 2022-06-12 DIAGNOSIS — R079 Chest pain, unspecified: Secondary | ICD-10-CM | POA: Diagnosis not present

## 2022-06-12 DIAGNOSIS — R531 Weakness: Secondary | ICD-10-CM | POA: Diagnosis not present

## 2022-06-12 DIAGNOSIS — Z79899 Other long term (current) drug therapy: Secondary | ICD-10-CM | POA: Diagnosis not present

## 2022-06-12 DIAGNOSIS — R509 Fever, unspecified: Secondary | ICD-10-CM | POA: Diagnosis not present

## 2022-06-12 DIAGNOSIS — E785 Hyperlipidemia, unspecified: Secondary | ICD-10-CM | POA: Diagnosis not present

## 2022-06-12 DIAGNOSIS — R5381 Other malaise: Secondary | ICD-10-CM | POA: Diagnosis not present

## 2022-06-12 DIAGNOSIS — I21A1 Myocardial infarction type 2: Secondary | ICD-10-CM | POA: Diagnosis not present

## 2022-06-12 DIAGNOSIS — R001 Bradycardia, unspecified: Secondary | ICD-10-CM | POA: Diagnosis not present

## 2022-06-12 NOTE — Telephone Encounter (Signed)
Please document your conversation with her. She was advised that she really needed to go to the ED.

## 2022-06-12 NOTE — Telephone Encounter (Signed)
Pt sch for OV w/Dr.Sonnenberg today 06/12/22 '@3'$ :30 due to her refusing to go to ED for evaluation.

## 2022-06-12 NOTE — Telephone Encounter (Signed)
Patient states she is calling to follow-up on the MyChart message she sent to [place] earlier.  I spoke with patient and called Access Nurse.  I gave all the information to Access Nurse and they will have the next available nurse call patient.  Per Access Nurse, I asked patient to please call us back if her symptoms get worse.

## 2022-06-12 NOTE — Telephone Encounter (Signed)
LMTCB to advise patient that she needed to be seen ASAP if not be ED then at least UC for evaluation.

## 2022-06-13 DIAGNOSIS — E785 Hyperlipidemia, unspecified: Secondary | ICD-10-CM | POA: Diagnosis not present

## 2022-06-13 DIAGNOSIS — D696 Thrombocytopenia, unspecified: Secondary | ICD-10-CM | POA: Diagnosis not present

## 2022-06-13 DIAGNOSIS — R079 Chest pain, unspecified: Secondary | ICD-10-CM | POA: Diagnosis not present

## 2022-06-13 DIAGNOSIS — I208 Other forms of angina pectoris: Secondary | ICD-10-CM | POA: Diagnosis not present

## 2022-06-14 ENCOUNTER — Telehealth: Payer: Self-pay

## 2022-06-14 DIAGNOSIS — I208 Other forms of angina pectoris: Secondary | ICD-10-CM | POA: Diagnosis not present

## 2022-06-14 NOTE — Telephone Encounter (Signed)
Transition Care Management Unsuccessful Follow-up Telephone Call  Date of discharge and from where:  06/13/22 [facility]  Attempts:  1st Attempt  Reason for unsuccessful TCM follow-up call:  Unable to reach patient

## 2022-06-17 ENCOUNTER — Telehealth: Payer: Self-pay

## 2022-06-17 NOTE — Telephone Encounter (Signed)
Patient states she was hospitalized at [facility] in [place] for chest pain last week.  Patient states she was told to schedule a hospital follow-up appointment with Dr. Einar Pheasant.  Dr. Bary Leriche next available appointment is 07/05/2022.  Patient states she is scheduled to follow-up with a doctor at a chest pain clinic on 07/08/2022.  Patient states she believes it might be better to see Dr. Nicki Reaper after her visit with the chest pain clinic doctor.  I scheduled patient to see Dr. Nicki Reaper on 07/12/2022, and let patient know that I would send a message to Dr. Nicki Reaper so she will be in the loop and can reach out to her if she feels like she needs to see her sooner.

## 2022-06-17 NOTE — Telephone Encounter (Signed)
Transition Care Management Follow-up Telephone Call Date of discharge and from where: 06/13/22 [facility] How have you been since you were released from the hospital? Patient states,"I am doing very well. I am doing everything I normally do." Denies chest pain/pressure/palpitations, numbness, dizziness, headache and all other harmful symptoms.  Any questions or concerns? No  Items Reviewed: Did the pt receive and understand the discharge instructions provided? Yes  Medications obtained and verified? Yes  Any new allergies since your discharge? No  Dietary orders reviewed? Yes Do you have support at home? Yes   Home Care and Equipment/Supplies: Were home health services ordered? Np  Functional Questionnaire: (I = Independent and D = Dependent) ADLs: I  Bathing/Dressing- I  Meal Prep- I  Eating- I  Maintaining continence- I  Transferring/Ambulation- I  Managing Meds- I  Follow up appointments reviewed:  PCP Hospital f/u appt confirmed? Yes  Scheduled to see PCP on 07/12/22 @ 11:00. Patient will notify PCP if any symptom presents or worsens.  [facility] f/u appt confirmed? Yes  Scheduled to see [facility] on 07/08/22. Are transportation arrangements needed? No  If their condition worsens, is the pt aware to call PCP or go to the Emergency Dept.? Yes Was the patient provided with contact information for the PCP's office or ED? Yes Was to pt encouraged to call back with questions or concerns? No

## 2022-06-18 NOTE — Telephone Encounter (Signed)
Pt scheduled to see Dr Margaretmary Eddy at [facility] on 9/11

## 2022-06-18 NOTE — Telephone Encounter (Signed)
Please confirm how she is doing?  It appears she was requesting to hold on appt with me until after she sees cardiology.  Please confirm which cardiologist she is seeing, etc.

## 2022-06-19 DIAGNOSIS — M19072 Primary osteoarthritis, left ankle and foot: Secondary | ICD-10-CM | POA: Diagnosis not present

## 2022-06-19 DIAGNOSIS — M7672 Peroneal tendinitis, left leg: Secondary | ICD-10-CM | POA: Diagnosis not present

## 2022-06-19 NOTE — Telephone Encounter (Signed)
See note. Please confirm how she is doing.  See if agreeable to see me for earlier appt.

## 2022-06-19 NOTE — Telephone Encounter (Signed)
S/w pt - confirmed she is feeling well as of today. Stated she would prefer to keep appointment as is, wants to see "chest pain clinic" first and then see you.  Will call if anything changes.

## 2022-06-30 DIAGNOSIS — R0789 Other chest pain: Secondary | ICD-10-CM | POA: Diagnosis not present

## 2022-06-30 DIAGNOSIS — Z882 Allergy status to sulfonamides status: Secondary | ICD-10-CM | POA: Diagnosis not present

## 2022-06-30 DIAGNOSIS — E785 Hyperlipidemia, unspecified: Secondary | ICD-10-CM | POA: Diagnosis not present

## 2022-06-30 DIAGNOSIS — Z7982 Long term (current) use of aspirin: Secondary | ICD-10-CM | POA: Diagnosis not present

## 2022-06-30 DIAGNOSIS — Z87891 Personal history of nicotine dependence: Secondary | ICD-10-CM | POA: Diagnosis not present

## 2022-06-30 DIAGNOSIS — R079 Chest pain, unspecified: Secondary | ICD-10-CM | POA: Diagnosis not present

## 2022-06-30 DIAGNOSIS — M810 Age-related osteoporosis without current pathological fracture: Secondary | ICD-10-CM | POA: Diagnosis not present

## 2022-07-01 DIAGNOSIS — R079 Chest pain, unspecified: Secondary | ICD-10-CM | POA: Diagnosis not present

## 2022-07-03 DIAGNOSIS — E785 Hyperlipidemia, unspecified: Secondary | ICD-10-CM | POA: Diagnosis not present

## 2022-07-03 DIAGNOSIS — R072 Precordial pain: Secondary | ICD-10-CM | POA: Diagnosis not present

## 2022-07-03 DIAGNOSIS — R778 Other specified abnormalities of plasma proteins: Secondary | ICD-10-CM | POA: Diagnosis not present

## 2022-07-10 DIAGNOSIS — R072 Precordial pain: Secondary | ICD-10-CM | POA: Diagnosis not present

## 2022-07-10 DIAGNOSIS — R079 Chest pain, unspecified: Secondary | ICD-10-CM | POA: Diagnosis not present

## 2022-07-10 DIAGNOSIS — R778 Other specified abnormalities of plasma proteins: Secondary | ICD-10-CM | POA: Diagnosis not present

## 2022-07-12 ENCOUNTER — Inpatient Hospital Stay: Payer: Medicare HMO | Admitting: Internal Medicine

## 2022-07-12 DIAGNOSIS — R072 Precordial pain: Secondary | ICD-10-CM | POA: Diagnosis not present

## 2022-07-12 DIAGNOSIS — R778 Other specified abnormalities of plasma proteins: Secondary | ICD-10-CM | POA: Diagnosis not present

## 2022-07-17 ENCOUNTER — Encounter: Payer: Self-pay | Admitting: Internal Medicine

## 2022-07-17 DIAGNOSIS — R079 Chest pain, unspecified: Secondary | ICD-10-CM | POA: Insufficient documentation

## 2022-07-24 DIAGNOSIS — I208 Other forms of angina pectoris: Secondary | ICD-10-CM | POA: Diagnosis not present

## 2022-07-24 DIAGNOSIS — E7849 Other hyperlipidemia: Secondary | ICD-10-CM | POA: Diagnosis not present

## 2022-07-24 DIAGNOSIS — R0789 Other chest pain: Secondary | ICD-10-CM | POA: Diagnosis not present

## 2022-08-09 DIAGNOSIS — R001 Bradycardia, unspecified: Secondary | ICD-10-CM | POA: Diagnosis not present

## 2022-10-04 ENCOUNTER — Ambulatory Visit
Admission: RE | Admit: 2022-10-04 | Discharge: 2022-10-04 | Disposition: A | Discharge status: Home / Self Care | Payer: Medicare HMO | Source: Ambulatory Visit | Attending: Physician Assistant | Admitting: Physician Assistant

## 2022-10-04 VITALS — BP 112/52 | HR 66 | Temp 98.2°F | Resp 20

## 2022-10-04 DIAGNOSIS — M79675 Pain in left toe(s): Secondary | ICD-10-CM

## 2022-10-04 DIAGNOSIS — R234 Changes in skin texture: Secondary | ICD-10-CM | POA: Diagnosis not present

## 2022-10-04 DIAGNOSIS — L03032 Cellulitis of left toe: Secondary | ICD-10-CM | POA: Diagnosis not present

## 2022-10-04 MED ORDER — CEPHALEXIN 500 MG PO CAPS: 500.0000 mg | ORAL_CAPSULE | Freq: Three times a day (TID) | ORAL | 0 refills | Status: DC

## 2022-10-04 NOTE — ED Provider Notes (Signed)
[facility]    CSN: [id number] Arrival date & time: 10/04/22  6314      History   Chief Complaint No chief complaint on file.   HPI Mary Everett is a 76 y.o. female presenting redness and swelling of the distal 3rd left toe x 5 days. States the toe began hurting the day after she clipped her toenails short. She has soaked her toe and says it has improved from yesterday. There is a small scab of the toe but she says she does not remember cutting of skin when she was clipping her toenails.  She has not had any fevers.  She says there is been little bleeding from the scab but no pustular drainage.  No severe pain.  No other complaints.  HPI  Past Medical History:  Diagnosis Date   Cataract    Hypercholesteremia 02/15/2022   Hypoglycemia    Osteoporosis 11/21/2017   Tubal pregnancy     Patient Active Problem List   Diagnosis Date Noted   Chest pain 07/17/2022   Encounter for completion of form with patient 03/26/2022   Hypercholesteremia 02/15/2022   Crackling sound in ear 01/27/2022   Special screening for malignant neoplasms, colon    Polyp of colon    Wrist fracture 07/09/2020   Acquired neutropenia ([place]) 01/13/2020   Healthcare maintenance 01/08/2020   Leukopenia 01/08/2020   Thrombocytopenia ([place]) 01/08/2020   Eyelid dermatitis, infectious 01/05/2020   Abscess of buttock 08/15/2019   Colon cancer screening 08/15/2019   Breast cancer screening 08/15/2019   Acute non-recurrent maxillary sinusitis 10/26/2018   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   Insect bite 05/19/2018   Osteoporosis 11/21/2017   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      Home Medications    Prior to Admission medications   Medication Sig Start Date End Date Taking? Authorizing Provider  cephALEXin (KEFLEX) 500 MG capsule Take 1 capsule (500 mg total) by mouth 3 (three) times daily for 5 days. 10/04/22 10/09/22 Yes Danton Clap, PA-C  Biotin 5 MG TABS Take 10 mg by mouth daily.    [provider]  Ca Cit Malate-Cholecalciferol (CALCIUM CITRATE MALATE-VIT D PO) Take 3 tablets by mouth in the morning and at bedtime.    [provider]  [facility] Liver Oil CAPS Take 1 capsule by mouth daily.    [provider]  folic acid (FOLVITE) 970 MCG tablet Take 800 mcg by mouth daily.    [provider]  ibuprofen (ADVIL) 800 MG tablet Take 1 tablet (800 mg total) by mouth every 8 (eight) hours as needed. 05/23/21   Fredirick Maudlin, MD  Magnesium Oxide 420 (252 Mg) MG TABS Take 420 mg by mouth every evening.    [provider]  Multiple Vitamins-Minerals (PX COMPLETE SENIOR MULTIVITS) TABS Take 1 tablet by mouth daily.    [provider]    Family History Family  History  Problem Relation Age of Onset   Cancer Mother 38       lung   Cancer Father 58       brain   Memory loss Sister     Social History Social History   Tobacco Use   Smoking status: Former    Types: Cigarettes    Quit date: 10/17/1972    Years since quitting: 49.9   Smokeless tobacco: Never  Vaping Use   Vaping Use: Never used  Substance Use Topics   Alcohol use: No   Drug use: No     Allergies   Sulfa antibiotics   Review of Systems Review of Systems  Constitutional:  Negative for fatigue and fever.  Musculoskeletal:  Negative for arthralgias and joint swelling.   Skin:  Positive for color change. Negative for wound.  Neurological:  Negative for weakness and numbness.     Physical Exam Triage Vital Signs ED Triage Vitals  Enc Vitals Group     BP 10/04/22 1022 (!) 112/52     Pulse Rate 10/04/22 1022 66     Resp 10/04/22 1022 20     Temp 10/04/22 1022 98.2 F (36.8 C)     Temp Source 10/04/22 1022 Oral     SpO2 10/04/22 1022 97 %     Weight --      Height --      Head Circumference --      Peak Flow --      Pain Score 10/04/22 1025 0     Pain Loc --      Pain Edu? --      Excl. in [place]? --    No data found.  Updated Vital Signs BP (!) 112/52 (BP Location: Right Arm)   Pulse 66   Temp 98.2 F (36.8 C) (Oral)   Resp 20   SpO2 97%      Physical Exam Vitals and nursing note reviewed.  Constitutional:      General: She is not in acute distress.    Appearance: Normal appearance. She is not ill-appearing or toxic-appearing.  HENT:     Head: Normocephalic and atraumatic.  Eyes:     General: No scleral icterus.       Right eye: No discharge.        Left eye: No discharge.     Conjunctiva/sclera: Conjunctivae normal.  Cardiovascular:     Rate and Rhythm: Normal rate and regular rhythm.     Pulses: Normal pulses.  Pulmonary:     Effort: Pulmonary effort is normal. No respiratory distress.  Musculoskeletal:     Cervical back: Neck supple.     Comments: Left foot: There is slight swelling of the distal left third toe.  Small scab which is tender to palpation of the distal toe close to the nail.  No obvious ingrown toenail.  Slight erythema of the distal toe.  Full range of motion.  Skin:    General: Skin is dry.  Neurological:     General: No focal deficit present.     Mental Status: She is alert. Mental status is at baseline.     Motor: No weakness.     Gait: Gait normal.  Psychiatric:        Mood and Affect: Mood normal.        Behavior: Behavior normal.        Thought Content: Thought content normal.      UC  Treatments / Results  Labs (  all labs ordered are listed, but only abnormal results are displayed) Labs Reviewed - No data to display  EKG   Radiology No results found.  Procedures Procedures (including critical care time)  Medications Ordered in UC Medications - No data to display  Initial Impression / Assessment and Plan / UC Course  I have reviewed the triage vital signs and the nursing notes.  Pertinent labs & imaging results that were available during my care of the patient were reviewed by me and considered in my medical decision making (see chart for details).   76 year old female presenting for left third toe pain for the past few days after clipping her toenails.  On exam there is a small scab adjacent to the toenail which has been clipped short.  The scab is tender.  There is no bleeding or drainage.  There is slight swelling and erythema of the distal toe but no significant findings.  Minor scab and skin infection.  Advise continue with warm soaks, cleaning with soap and water apply Neosporin.  I printed a watch and wait prescription for Keflex in case the redness and swelling are not improving or if they worsen.   Final Clinical Impressions(s) / UC Diagnoses   Final diagnoses:  Pain of toe of left foot  Scab  Cellulitis of toe of left foot     Discharge Instructions      -Very minor injury and minor infection of the skin.  Continue with warm soaks a couple times a day.  Do not pick at the scab.  Will eventually fall off. - You can take Tylenol for pain.  Apply Neosporin to the area.  I printed prescription for antibiotic in case you feel the swelling or redness worsen or you have made any continued improvement in the next couple days.    ED Prescriptions     Medication Sig Dispense Auth. Provider   cephALEXin (KEFLEX) 500 MG capsule Take 1 capsule (500 mg total) by mouth 3 (three) times daily for 5 days. 15 capsule Danton Clap, PA-C      PDMP not  reviewed this encounter.   Danton Clap, PA-C 10/04/22 1100

## 2022-10-04 NOTE — ED Triage Notes (Signed)
Pt reports 5 days ago her left 3rd metatarsal began to hurt. She clipped her toe nails but that was the only thing she has done differently. Reports no injury. She says its a small red "blood bump or blister" in the corner of her toe. Painful to the touch. She has been soaking it in epsom salt. Toe is swollen

## 2022-10-04 NOTE — Discharge Instructions (Signed)
-  Very minor injury and minor infection of the skin.  Continue with warm soaks a couple times a day.  Do not pick at the scab.  Will eventually fall off. - You can take Tylenol for pain.  Apply Neosporin to the area.  I printed prescription for antibiotic in case you feel the swelling or redness worsen or you have made any continued improvement in the next couple days.

## 2022-10-06 ENCOUNTER — Encounter: Payer: Self-pay | Admitting: Internal Medicine

## 2022-10-07 ENCOUNTER — Telehealth: Payer: Self-pay | Admitting: Internal Medicine

## 2022-10-07 ENCOUNTER — Encounter: Payer: Self-pay | Admitting: Family Medicine

## 2022-10-07 ENCOUNTER — Telehealth: Payer: Self-pay | Admitting: Family Medicine

## 2022-10-07 ENCOUNTER — Ambulatory Visit (INDEPENDENT_AMBULATORY_CARE_PROVIDER_SITE_OTHER): Payer: Medicare HMO | Admitting: Family Medicine

## 2022-10-07 VITALS — BP 90/60 | HR 58 | Temp 98.6°F | Ht 63.0 in | Wt 105.0 lb

## 2022-10-07 DIAGNOSIS — L03032 Cellulitis of left toe: Secondary | ICD-10-CM | POA: Diagnosis not present

## 2022-10-07 DIAGNOSIS — L0390 Cellulitis, unspecified: Secondary | ICD-10-CM | POA: Insufficient documentation

## 2022-10-07 MED ORDER — CEPHALEXIN 500 MG PO CAPS: 500.0000 mg | ORAL_CAPSULE | Freq: Four times a day (QID) | ORAL | 0 refills | Status: DC

## 2022-10-07 NOTE — Telephone Encounter (Signed)
I called the patient and informed her that the provider stated the Keflex was not written for the right dosage and he sent in a new prescription to reflect that she take it 4 times a day and she understood. Vaeda Westall,cma

## 2022-10-07 NOTE — Progress Notes (Signed)
Tommi Rumps, MD Phone: [phone]  Carlina Derks is a 76 y.o. female who presents today for same-day visit.  Left foot swelling and redness: Patient notes onset of this 6 days ago.  She had clipped her toenails and 2 days later her left middle toe tibial aspect became sore near the nail.  She subsequently developed some redness and swelling in the toe.  She went to urgent care and they prescribed Keflex though she notes they advised her to wait a couple of days to see if it improved on its own.  She has not started on the Keflex.  She has had more swelling and now has some swelling up into her ankle and just above her ankle.  She notes this has been gradual onset.  She had no sudden onset pain.  She had a little bleeding from the ulnar aspect of her left middle toe initially and then developed a scab.  Social History   Tobacco Use  Smoking Status Former   Types: Cigarettes   Quit date: 10/17/1972   Years since quitting: 50.0  Smokeless Tobacco Never    Current Outpatient Medications on File Prior to Visit  Medication Sig Dispense Refill   Biotin 5 MG TABS Take 10 mg by mouth daily.     Ca Cit Malate-Cholecalciferol (CALCIUM CITRATE MALATE-VIT D PO) Take 3 tablets by mouth in the morning and at bedtime.     C[employer] Liver Oil CAPS Take 1 capsule by mouth daily.     folic acid (FOLVITE) 790 MCG tablet Take 800 mcg by mouth daily.     ibuprofen (ADVIL) 800 MG tablet Take 1 tablet (800 mg total) by mouth every 8 (eight) hours as needed. 30 tablet 0   Magnesium Oxide 420 (252 Mg) MG TABS Take 420 mg by mouth every evening.     Multiple Vitamins-Minerals (PX COMPLETE SENIOR MULTIVITS) TABS Take 1 tablet by mouth daily.     No current facility-administered medications on file prior to visit.     ROS see history of present illness  Objective  Physical Exam Vitals:   10/07/22 1042  BP: 90/60  Pulse: (!) 58  Temp: 98.6 F (37 C)  SpO2: 98%    BP Readings from Last 3 Encounters:   10/07/22 90/60  10/04/22 (!) 112/52  03/20/22 104/60   Wt Readings from Last 3 Encounters:  10/07/22 105 lb (47.6 kg)  03/20/22 106 lb (48.1 kg)  01/21/22 107 lb 9.6 oz (48.8 kg)    Physical Exam Skin:    Comments: Left middle toe with a scab at the tibial aspect of the toenail, there is some surrounding erythema in this area, there is swelling of the toe, there is tenderness around the scab and erythema, there is swelling in her left foot and left ankle, bilateral calves are 32.5 cm, 2+ DP and PT pulses bilaterally      Assessment/Plan: Please see individual problem list.  Problem List Items Addressed This Visit     Cellulitis - Primary    I suspect her symptoms are related to cellulitis.  Calves are equal in size making DVT less likely.  Arterial embolism unlikely given gradual onset of her symptoms.  We will have her start on the Keflex from urgent care though she will take this 4 times daily.  I did not communicate that dosing to her prior to her leaving and I will have the [facility] contact her to update her on that.  We will also increase her  duration to 7 days.  If she has any rapidly spreading redness she will go to the emergency room or if she has worsening swelling or her symptoms or not improving she will let me know.      Relevant Medications   cephALEXin (KEFLEX) 500 MG capsule      Return if symptoms worsen or fail to improve.   Tommi Rumps, MD [place]

## 2022-10-07 NOTE — Telephone Encounter (Signed)
Pt sched w/ sonnenberg

## 2022-10-07 NOTE — Assessment & Plan Note (Signed)
I suspect her symptoms are related to cellulitis.  Calves are equal in size making DVT less likely.  Arterial embolism unlikely given gradual onset of her symptoms.  We will have her start on the Keflex from urgent care though she will take this 4 times daily.  I did not communicate that dosing to her prior to her leaving and I will have the [place] [facility] contact her to update her on that.  We will also increase her duration to 7 days.  If she has any rapidly spreading redness she will go to the emergency room or if she has worsening swelling or her symptoms or not improving she will let me know.

## 2022-10-07 NOTE — Telephone Encounter (Signed)
Please call the patient and let her know that I would like for her to take the Keflex 4 times daily.  I sent a new prescription into her pharmacy that reflects this.  She will take it for 7 days.  This is a change from the prescription that was sent in by urgent care.  I did not realize until she left the office that they had only prescribed it 3 times a day for 5 days.

## 2022-10-07 NOTE — Telephone Encounter (Signed)
Pt called stating the middle toe on her left foot is infected so she went to urgent care on friday and they prescribed her some antibiotics. They told her to not take it if the toe was getting better but its not. The toe is more painful, throbbing and swelling. Pt would like to be called

## 2022-10-08 NOTE — Telephone Encounter (Signed)
Reviewed.  Saw Dr Caryl Bis. Being treated for cellulitis.  Confirm doing ok.  Yes she can take a probiotic daily while on the antibiotics and for two weeks after completing abx.

## 2022-10-09 ENCOUNTER — Encounter: Payer: Self-pay | Admitting: Internal Medicine

## 2022-11-15 DIAGNOSIS — D2261 Melanocytic nevi of right upper limb, including shoulder: Secondary | ICD-10-CM | POA: Diagnosis not present

## 2022-11-15 DIAGNOSIS — C44319 Basal cell carcinoma of skin of other parts of face: Secondary | ICD-10-CM | POA: Diagnosis not present

## 2022-11-15 DIAGNOSIS — Z85828 Personal history of other malignant neoplasm of skin: Secondary | ICD-10-CM | POA: Diagnosis not present

## 2022-11-15 DIAGNOSIS — D2262 Melanocytic nevi of left upper limb, including shoulder: Secondary | ICD-10-CM | POA: Diagnosis not present

## 2022-11-15 DIAGNOSIS — D485 Neoplasm of uncertain behavior of skin: Secondary | ICD-10-CM | POA: Diagnosis not present

## 2022-11-15 DIAGNOSIS — X32XXXA Exposure to sunlight, initial encounter: Secondary | ICD-10-CM | POA: Diagnosis not present

## 2022-11-15 DIAGNOSIS — L570 Actinic keratosis: Secondary | ICD-10-CM | POA: Diagnosis not present

## 2022-11-15 DIAGNOSIS — D2272 Melanocytic nevi of left lower limb, including hip: Secondary | ICD-10-CM | POA: Diagnosis not present

## 2022-12-02 ENCOUNTER — Encounter: Payer: Self-pay | Admitting: Internal Medicine

## 2022-12-02 NOTE — Telephone Encounter (Signed)
Pt sched for Wednesday for eval

## 2022-12-04 ENCOUNTER — Encounter: Payer: Self-pay | Admitting: Internal Medicine

## 2022-12-04 ENCOUNTER — Ambulatory Visit (INDEPENDENT_AMBULATORY_CARE_PROVIDER_SITE_OTHER): Payer: Medicare HMO | Admitting: Internal Medicine

## 2022-12-04 VITALS — BP 112/68 | HR 69 | Temp 97.9°F | Resp 16 | Ht 63.0 in | Wt 102.0 lb

## 2022-12-04 DIAGNOSIS — L0231 Cutaneous abscess of buttock: Secondary | ICD-10-CM

## 2022-12-04 DIAGNOSIS — D709 Neutropenia, unspecified: Secondary | ICD-10-CM

## 2022-12-04 MED ORDER — DOXYCYCLINE HYCLATE 100 MG PO TABS: 100.0000 mg | ORAL_TABLET | Freq: Two times a day (BID) | ORAL | 0 refills | Status: DC

## 2022-12-04 NOTE — Progress Notes (Signed)
Subjective:    Patient ID: Mary Everett, female    DOB: 04/15/1946, 77 y.o.   MRN: [id number]  Patient here for  Chief Complaint  Patient presents with   Abscess    Buttocks     HPI Here for work in appt.  Work in to discuss - persistent lesion/abscess - right buttock.  Has a history of abscess.  Saw surgery previously. Is s/p incision and curettage of abscess cavity 04/2021.  Returned.  Has noticed over the past week - increased discomfort/pain.  Larger.  Did notice some draining today.  No fever.  No nausea or vomiting.     Past Medical History:  Diagnosis Date   Cataract    Hypercholesteremia 02/15/2022   Hypoglycemia    Osteoporosis 11/21/2017   Special screening for malignant neoplasms, colon    Tubal pregnancy    Past Surgical History:  Procedure Laterality Date   CATARACT EXTRACTION W/ INTRAOCULAR LENS IMPLANT Bilateral    COLONOSCOPY WITH PROPOFOL N/A 12/19/2020   Procedure: COLONOSCOPY WITH PROPOFOL;  Surgeon: [name], MD;  Location: [place];  Service: Endoscopy;  Laterality: N/A;  requests early priority 4   ECTOPIC PREGNANCY SURGERY     POLYPECTOMY N/A 12/19/2020   Procedure: POLYPECTOMY;  Surgeon: [name], MD;  Location: [place];  Service: Endoscopy;  Laterality: N/A;   Family History  Problem Relation Age of Onset   Cancer Mother 21       lung   Cancer Father 74       brain   Memory loss Sister    Social History   Socioeconomic History   Marital status: Married    Spouse name: Not on file   Number of children: Not on file   Years of education: Not on file   Highest education level: Not on file  Occupational History   Not on file  Tobacco Use   Smoking status: Former    Types: Cigarettes    Quit date: 10/17/1972    Years since quitting: 50.1   Smokeless tobacco: Never  Vaping Use   Vaping Use: Never used  Substance and Sexual Activity   Alcohol use: No   Drug use: No   Sexual activity: Not on  file  Other Topics Concern   Not on file  Social History Narrative   In [place]; with husband; 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    Social Determinants of Health   Financial Resource Strain: Low Risk  (11/16/2020)   Overall Financial Resource Strain (CARDIA)    Difficulty of Paying Living Expenses: Not hard at all  Food Insecurity: No Food Insecurity (11/16/2020)   Hunger Vital Sign    Worried About Running Out of Food in the Last Year: Never true    Ran Out of Food in the Last Year: Never true  Transportation Needs: No Transportation Needs (11/16/2020)   PRAPARE - Hydrologist (Medical): No    Lack of Transportation (Non-Medical): No  Physical Activity: Sufficiently Active (11/16/2020)   Exercise Vital Sign    Days of Exercise per Week: 7 days    Minutes of Exercise per Session: 120 min  Stress: No Stress Concern Present (11/16/2020)   [place]    Feeling of Stress : Not at all  Social Connections: [place] (11/16/2020)   Social Connection and Isolation Panel [NHANES]  Frequency of Communication with Friends and Family: More than three times a week    Frequency of Social Gatherings with Friends and Family: More than three times a week    Attends Religious Services: 1 to 4 times per year    Active Member of [employer] or Organizations: Yes    Attends Archivist Meetings: Not on file    Marital Status: Married     Review of Systems  Constitutional:  Negative for appetite change and fever.  Respiratory:  Negative for cough, chest tightness and shortness of breath.   Cardiovascular:  Negative for chest pain and leg swelling.  Gastrointestinal:  Negative for abdominal pain, diarrhea, nausea and vomiting.  Genitourinary:  Negative for difficulty urinating and dysuria.  Musculoskeletal:  Negative for joint swelling and  myalgias.  Skin:  Negative for color change and rash.  Psychiatric/Behavioral:  Negative for agitation and dysphoric mood.        Objective:     BP 112/68   Pulse 69   Temp 97.9 F (36.6 C)   Resp 16   Ht 5' 3"$  (1.6 m)   Wt 102 lb (46.3 kg)   SpO2 97%   BMI 18.07 kg/m  Wt Readings from Last 3 Encounters:  12/04/22 102 lb (46.3 kg)  10/07/22 105 lb (47.6 kg)  03/20/22 106 lb (48.1 kg)    Physical Exam Vitals reviewed.  Constitutional:      General: She is not in acute distress.    Appearance: Normal appearance.  HENT:     Head: Normocephalic and atraumatic.     Right Ear: External ear normal.     Left Ear: External ear normal.  Eyes:     General: No scleral icterus.       Right eye: No discharge.        Left eye: No discharge.     Conjunctiva/sclera: Conjunctivae normal.  Neck:     Thyroid: No thyromegaly.  Cardiovascular:     Rate and Rhythm: Normal rate and regular rhythm.  Pulmonary:     Effort: No respiratory distress.     Breath sounds: Normal breath sounds. No wheezing.  Abdominal:     General: Bowel sounds are normal.     Palpations: Abdomen is soft.     Tenderness: There is no abdominal tenderness.  Genitourinary:    Comments: Abscess - right buttock.  Draining some.  No surrounding erythema.  Increased tenderness to palpation.   Musculoskeletal:        General: No swelling or tenderness.     Cervical back: Neck supple. No tenderness.  Lymphadenopathy:     Cervical: No cervical adenopathy.  Skin:    Findings: No erythema or rash.  Neurological:     Mental Status: She is alert.  Psychiatric:        Mood and Affect: Mood normal.        Behavior: Behavior normal.      Outpatient Encounter Medications as of 12/04/2022  Medication Sig   doxycycline (VIBRA-TABS) 100 MG tablet Take 1 tablet (100 mg total) by mouth 2 (two) times daily.   Biotin 5 MG TABS Take 10 mg by mouth daily.   Ca Cit Malate-Cholecalciferol (CALCIUM CITRATE MALATE-VIT D PO)  Take 3 tablets by mouth in the morning and at bedtime.   Cod Liver Oil CAPS Take 1 capsule by mouth daily.   folic acid (FOLVITE) Q000111Q MCG tablet Take 800 mcg by mouth daily.   ibuprofen (ADVIL) 800 MG  tablet Take 1 tablet (800 mg total) by mouth every 8 (eight) hours as needed.   Magnesium Oxide 420 (252 Mg) MG TABS Take 420 mg by mouth every evening.   Multiple Vitamins-Minerals (PX COMPLETE SENIOR MULTIVITS) TABS Take 1 tablet by mouth daily.   [DISCONTINUED] cephALEXin (KEFLEX) 500 MG capsule Take 1 capsule (500 mg total) by mouth 4 (four) times daily.   No facility-administered encounter medications on file as of 12/04/2022.     Lab Results  Component Value Date   WBC 4.7 02/26/2022   HGB 13.1 02/26/2022   HCT 40.4 02/26/2022   PLT 131.0 (L) 02/26/2022   GLUCOSE 81 02/14/2022   CHOL 208 (H) 02/14/2022   TRIG 40.0 02/14/2022   HDL 82.60 02/14/2022   LDLCALC 117 (H) 02/14/2022   ALT 20 02/14/2022   AST 26 02/14/2022   NA 135 02/14/2022   K 3.8 02/14/2022   CL 99 02/14/2022   CREATININE 0.55 02/14/2022   BUN 23 02/14/2022   CO2 32 02/14/2022   TSH 2.68 05/30/2021   HGBA1C 5.6 05/24/2012    No results found.     Assessment & Plan:  Abscess of buttock Assessment & Plan: Discussed warm compresses.  Is open.  Draining.  Place on doxycycline. Discussed surgery referral.  Name given.  Will notify me if agreeable.  Call with update.    Acquired neutropenia [facility]) Assessment & Plan: Followed by hematology.  Stable.    Other orders -     Doxycycline Hyclate; Take 1 tablet (100 mg total) by mouth 2 (two) times daily.  Dispense: 14 tablet; Refill: 0     Einar Pheasant, MD

## 2022-12-08 ENCOUNTER — Encounter: Payer: Self-pay | Admitting: Internal Medicine

## 2022-12-08 NOTE — Assessment & Plan Note (Signed)
Discussed warm compresses.  Is open.  Draining.  Place on doxycycline. Discussed surgery referral.  Name given.  Will notify me if agreeable.  Call with update.

## 2022-12-08 NOTE — Assessment & Plan Note (Signed)
Followed by hematology.  Stable.

## 2022-12-09 ENCOUNTER — Ambulatory Visit (INDEPENDENT_AMBULATORY_CARE_PROVIDER_SITE_OTHER): Payer: Medicare HMO

## 2022-12-09 ENCOUNTER — Encounter: Payer: Self-pay | Admitting: Internal Medicine

## 2022-12-09 VITALS — Ht 63.0 in | Wt 102.0 lb

## 2022-12-09 DIAGNOSIS — Z0000 Encounter for general adult medical examination without abnormal findings: Secondary | ICD-10-CM

## 2022-12-09 NOTE — Progress Notes (Signed)
Subjective:   Mary Everett is a 77 y.o. female who presents for Medicare Annual (Subsequent) preventive examination.  Review of Systems    No ROS.  Medicare Wellness Virtual Visit.  Visual/audio telehealth visit, UTA vital signs.   See social history for additional risk factors.   Cardiac Risk Factors include: advanced age (>35mn, >>82women)     Objective:    Today's Vitals   12/09/22 1006  Weight: 102 lb (46.3 kg)  Height: 5' 3"$  (1.6 m)   Body mass index is 18.07 kg/m.     12/09/2022   10:18 AM 11/19/2021    1:10 PM 06/11/2021   10:31 AM 05/23/2021    6:21 AM 05/16/2021    9:58 AM 12/19/2020    8:48 AM 09/12/2020   10:28 AM  Advanced Directives  Does Patient Have a Medical Advance Directive? No No Yes Yes Yes Yes No  Type of AScientist, physiologicalof ANeffsLiving will [place]Living will [place]Living will [place]Living will   Does patient want to make changes to medical advance directive?  No - Patient declined No - Patient declined No - Patient declined  No - Patient declined   Copy of [place] Chart?   No - copy requested No - copy requested No - copy requested Yes - validated most recent copy scanned in chart (See row information)   Would patient like information on creating a medical advance directive? No - Patient declined      No - Patient declined    Current Medications (verified) Outpatient Encounter Medications as of 12/09/2022  Medication Sig   Biotin 5 MG TABS Take 10 mg by mouth daily.   Ca Cit Malate-Cholecalciferol (CALCIUM CITRATE MALATE-VIT D PO) Take 3 tablets by mouth in the morning and at bedtime.   Cod Liver Oil CAPS Take 1 capsule by mouth daily.   doxycycline (VIBRA-TABS) 100 MG tablet Take 1 tablet (100 mg total) by mouth 2 (two) times daily.   folic acid (FOLVITE) [id number] tablet Take 800 mcg by mouth daily.   ibuprofen (ADVIL) 800 MG tablet  Take 1 tablet (800 mg total) by mouth every 8 (eight) hours as needed.   Magnesium Oxide 420 (252 Mg) MG TABS Take 420 mg by mouth every evening.   Multiple Vitamins-Minerals (PX COMPLETE SENIOR MULTIVITS) TABS Take 1 tablet by mouth daily.   No facility-administered encounter medications on file as of 12/09/2022.    Allergies (verified) Sulfa antibiotics   History: Past Medical History:  Diagnosis Date   Cataract    Hypercholesteremia 02/15/2022   Hypoglycemia    Osteoporosis 11/21/2017   Special screening for malignant neoplasms, colon    Tubal pregnancy    Past Surgical History:  Procedure Laterality Date   CATARACT EXTRACTION W/ INTRAOCULAR LENS IMPLANT Bilateral    COLONOSCOPY WITH PROPOFOL N/A 12/19/2020   Procedure: COLONOSCOPY WITH PROPOFOL;  Surgeon: TVirgel Manifold MD;  [place]  Service: Endoscopy;  Laterality: N/A;  requests early priority 4   ECTOPIC PREGNANCY SURGERY     POLYPECTOMY N/A 12/19/2020   Procedure: POLYPECTOMY;  Surgeon: TVirgel Manifold MD;  [place] [facility]  Service: Endoscopy;  Laterality: N/A;   Family History  Problem Relation Age of Onset   Cancer Mother 850      lung   Cancer Father 684      brain   Memory  loss Sister    Social History   Socioeconomic History   Marital status: Married    Spouse name: Not on file   Number of children: Not on file   Years of education: Not on file   Highest education level: Not on file  Occupational History   Not on file  Tobacco Use   Smoking status: Former    Types: Cigarettes    Quit date: 10/17/1972    Years since quitting: 50.1   Smokeless tobacco: Never  Vaping Use   Vaping Use: Never used  Substance and Sexual Activity   Alcohol use: No   Drug use: No   Sexual activity: Not on file  Other Topics Concern   Not on file  Social History Narrative   In [place]; with husband; 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    Social Determinants of Health   Financial Resource Strain: Low Risk  (12/09/2022)   Overall Financial Resource Strain (CARDIA)    Difficulty of Paying Living Expenses: Not hard at all  Food Insecurity: No Food Insecurity (12/09/2022)   Hunger Vital Sign    Worried About Running Out of Food in the Last Year: Never true    Ran Out of Food in the Last Year: Never true  Transportation Needs: No Transportation Needs (12/09/2022)   PRAPARE - Hydrologist (Medical): No    Lack of Transportation (Non-Medical): No  Physical Activity: Sufficiently Active (12/09/2022)   Exercise Vital Sign    Days of Exercise per Week: 7 days    Minutes of Exercise per Session: 120 min  Stress: No Stress Concern Present (12/09/2022)   [place]    Feeling of Stress : Not at all  Social Connections: [place] (12/09/2022)   Social Connection and Isolation Panel [NHANES]    Frequency of Communication with Friends and Family: More than three times a week    Frequency of Social Gatherings with Friends and Family: More than three times a week    Attends Religious Services: 1 to 4 times per year    Active Member of [employer] or Organizations: Yes    Attends Music therapist: Not on file    Marital Status: Married    Tobacco Counseling Counseling given: Not Answered   Clinical Intake:  Pre-visit preparation completed: Yes        Diabetes: No  How often do you need to have someone help you when you read instructions, pamphlets, or other written materials from your doctor or pharmacy?: 1 - Never    Interpreter Needed?: No      Activities of Daily Living    12/09/2022   10:09 AM  In your present state of health, do you have any difficulty performing the following activities:  Hearing? 0  Vision? 0  Difficulty concentrating or making decisions? 0  Walking or  climbing stairs? 0  Dressing or bathing? 0  Doing errands, shopping? 0  Preparing Food and eating ? N  Using the Toilet? N  In the past six months, have you accidently leaked urine? Y  Comment Managed with daily pad as needed  Do you have problems with loss of bowel control? N  Managing your Medications? N  Managing your Finances? N  Housekeeping or managing your Housekeeping? N    Patient Care Team: Einar Pheasant, MD as PCP - General (Internal Medicine)  Indicate  any recent Medical Services you may have received from other than [facility] providers in the past year (date may be approximate).     Assessment:   This is a routine wellness examination for Janelys.  I connected with  Falecia Perrett on 12/09/22 by a audio enabled telemedicine application and verified that I am speaking with the correct person using two identifiers.  Patient Location: Home  Provider Location: Office/Clinic  I discussed the limitations of evaluation and management by telemedicine. The patient expressed understanding and agreed to proceed.   Hearing/Vision screen Hearing Screening - Comments:: Patient is able to hear conversational tones without difficulty.  No issues reported.   Vision Screening - Comments:: Followed by [facility], Dr. Edison Pace Wears corrective lenses when reading. Cataract extraction, bilateral  They have regular follow up with their ophthalmologist.  Dietary issues and exercise activities discussed: Current Exercise Habits: Structured exercise class, Type of exercise: walking;treadmill;stretching (Strengthening exercise), Time (Minutes): > 60, Intensity: Moderate   Goals Addressed               This Visit's Progress     Patient Stated     Increase brain engagement activities (pt-stated)        I want to start playing the clarinet again.       Depression Screen    12/09/2022   10:23 AM 12/04/2022   10:33 AM 03/20/2022   11:17 AM 01/21/2022   10:10 AM 11/19/2021     1:13 PM 11/16/2020   11:45 AM 12/30/2019    2:51 PM  PHQ 2/9 Scores  PHQ - 2 Score 0 0 0 0 0 0 0    Fall Risk    12/09/2022   10:10 AM 12/04/2022   10:33 AM 03/20/2022   11:17 AM 01/21/2022   10:09 AM 11/19/2021    1:13 PM  [place] in the past year? 0 0 0 0 0  Number falls in past yr: 0 0  0 0  Injury with Fall? 0 0  0   Risk for fall due to :  No Fall Risks No Fall Risks No Fall Risks   Follow up Falls evaluation completed;Falls prevention discussed Falls evaluation completed Falls evaluation completed Falls evaluation completed Falls evaluation completed    FALL RISK PREVENTION PERTAINING TO THE HOME: Home free of loose throw rugs in walkways, pet beds, electrical cords, etc? Yes  Adequate lighting in your home to reduce risk of falls? Yes   ASSISTIVE DEVICES UTILIZED TO PREVENT FALLS: Life alert? No  Use of a cane, walker or w/c? No  Grab bars in the bathroom? No  Shower chair or bench in shower? No  Elevated toilet seat or a handicapped toilet? No   TIMED UP AND GO: Was the test performed? No .   Cognitive Function:        12/09/2022   10:11 AM 11/16/2019   12:07 PM  6CIT Screen  What Year? 0 points 0 points  What month? 0 points 0 points  What time? 0 points 0 points  Count back from 20 0 points   Months in reverse 0 points   Repeat phrase 0 points   Total Score 0 points     Immunizations Immunization History  Administered Date(s) Administered   Influenza, High Dose Seasonal PF 08/25/2018, 10/04/2022   Influenza,inj,Quad PF,6+ Mos 07/12/2019   Influenza-Unspecified 08/07/2020, 09/13/2021   Moderna SARS-COV2 Booster Vaccination 09/13/2021   PFIZER(Purple Top)SARS-COV-2 Vaccination 12/23/2019, 01/19/2020, 08/11/2020  Pneumococcal Conjugate-13 01/18/2021   Pneumococcal Polysaccharide-23 07/12/2019   Td 05/21/2016   Zoster Recombinat (Shingrix) 02/07/2021, 04/20/2021   Screening Tests Health Maintenance  Topic Date Due   COVID-19 Vaccine (4 -  2023-24 season) 12/20/2022 (Originally 06/28/2022)   MAMMOGRAM  02/13/2023   Medicare Annual Wellness (AWV)  12/10/2023   DTaP/Tdap/Td (2 - Tdap) 05/21/2026   COLONOSCOPY (Pts 45-30yr Insurance coverage will need to be confirmed)  12/20/2027   Pneumonia Vaccine 75 Years old  Completed   INFLUENZA VACCINE  Completed   DEXA SCAN  Completed   Hepatitis C Screening  Completed   Zoster Vaccines- Shingrix  Completed   HPV VACCINES  Aged Out   Health Maintenance There are no preventive care reminders to display for this patient.  Mammogram- due in April. Deferred for follow up at upcoming cpe, per patient preference. Unsure of scheduling location.   Lung Cancer Screening: (Low Dose CT Chest recommended if Age 75-80years, 30 pack-year currently smoking OR have quit w/in 15years.) does not qualify.   Hepatitis C Screening: Completed 12/2019.   Vision Screening: Recommended annual ophthalmology exams for early detection of glaucoma and other disorders of the eye.  Dental Screening: Recommended annual dental exams for proper oral hygiene  Community Resource Referral / Chronic Care Management: CRR required this visit?  No   CCM required this visit?  No      Plan:     I have personally reviewed and noted the following in the patient's chart:   Medical and social history Use of alcohol, tobacco or illicit drugs  Current medications and supplements including opioid prescriptions. Patient is not currently taking opioid prescriptions. Functional ability and status Nutritional status Physical activity Advanced directives List of other physicians Hospitalizations, surgeries, and ER visits in previous 12 months Vitals Screenings to include cognitive, depression, and falls Referrals and appointments  In addition, I have reviewed and discussed with patient certain preventive protocols, quality metrics, and best practice recommendations. A written personalized care plan for preventive  services as well as general preventive health recommendations were provided to patient.     DLeta Jungling LPN   [id number]

## 2022-12-09 NOTE — Patient Instructions (Addendum)
Ms. Mary Everett , Thank you for taking time to come for your Medicare Wellness Visit. I appreciate your ongoing commitment to your health goals. Please review the following plan we discussed and let me know if I can assist you in the future.   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        This is a list of the screening recommended for you and due dates:  Health Maintenance  Topic Date Due   COVID-19 Vaccine (4 - 2023-24 season) 12/20/2022*   Mammogram  02/13/2023   Medicare Annual Wellness Visit  12/10/2023   DTaP/Tdap/Td vaccine (2 - Tdap) 05/21/2026   Colon Cancer Screening  12/20/2027   Pneumonia Vaccine  Completed   Flu Shot  Completed   DEXA scan (bone density measurement)  Completed   Hepatitis C Screening: USPSTF Recommendation to screen - Ages 81-79 yo.  Completed   Zoster (Shingles) Vaccine  Completed   HPV Vaccine  Aged Out  *Topic was postponed. The date shown is not the original due date.    Advanced directives: End of life planning; Advance aging; Advanced directives discussed.  Copy of current HCPOA/Living Will requested once completed.    Conditions/risks identified: none new  Next appointment: Follow up in one year for your annual wellness visit    Preventive Care 65 Years and Older, Female Preventive care refers to lifestyle choices and visits with your health care provider that can promote health and wellness. What does preventive care include? A yearly physical exam. This is also called an annual well check. Dental exams once or twice a year. Routine eye exams. Ask your health care provider how often you should have your eyes checked. Personal lifestyle choices, including: Daily care of your teeth and gums. Regular physical activity. Eating a healthy diet. Avoiding tobacco and drug use. Limiting alcohol use. Practicing safe  sex. Taking low-dose aspirin every day. Taking vitamin and mineral supplements as recommended by your health care provider. What happens during an annual well check? The services and screenings done by your health care provider during your annual well check will depend on your age, overall health, lifestyle risk factors, and family history of disease. Counseling  Your health care provider may ask you questions about your: Alcohol use. Tobacco use. Drug use. Emotional well-being. Home and relationship well-being. Sexual activity. Eating habits. History of falls. Memory and ability to understand (cognition). Work and work Statistician. Reproductive health. Screening  You may have the following tests or measurements: Height, weight, and BMI. Blood pressure. Lipid and cholesterol levels. These may be checked every 5 years, or more frequently if you are over 32 years old. Skin check. Lung cancer screening. You may have this screening every year starting at age 46 if you have a 30-pack-year history of smoking and currently smoke or have quit within the past 15 years. Fecal occult blood test (FOBT) of the stool. You may have this test every year starting at age 14. Flexible sigmoidoscopy or colonoscopy. You may have a sigmoidoscopy every 5 years or a colonoscopy every 10 years starting at age 7. Hepatitis C blood test. Hepatitis B blood test. Sexually transmitted disease (STD) testing. Diabetes screening. This is done by checking your blood sugar (glucose) after you have not eaten for a while (fasting). You may have this done  every 1-3 years. Bone density scan. This is done to screen for osteoporosis. You may have this done starting at age 47. Mammogram. This may be done every 1-2 years. Talk to your health care provider about how often you should have regular mammograms. Talk with your health care provider about your test results, treatment options, and if necessary, the need for more  tests. Vaccines  Your health care provider may recommend certain vaccines, such as: Influenza vaccine. This is recommended every year. Tetanus, diphtheria, and acellular pertussis (Tdap, Td) vaccine. You may need a Td booster every 10 years. Zoster vaccine. You may need this after age 3. Pneumococcal 13-valent conjugate (PCV13) vaccine. One dose is recommended after age 70. Pneumococcal polysaccharide (PPSV23) vaccine. One dose is recommended after age 55. Talk to your health care provider about which screenings and vaccines you need and how often you need them. This information is not intended to replace advice given to you by your health care provider. Make sure you discuss any questions you have with your health care provider. Document Released: 11/10/2015 Document Revised: 07/03/2016 Document Reviewed: 08/15/2015 Elsevier Interactive Patient Education  2017 [place] Prevention in the Home Falls can cause injuries. They can happen to people of all ages. There are many things you can do to make your home safe and to help prevent falls. What can I do on the outside of my home? Regularly fix the edges of walkways and driveways and fix any cracks. Remove anything that might make you trip as you walk through a door, such as a raised step or threshold. Trim any bushes or trees on the path to your home. Use bright outdoor lighting. Clear any walking paths of anything that might make someone trip, such as rocks or tools. Regularly check to see if handrails are loose or broken. Make sure that both sides of any steps have handrails. Any raised decks and porches should have guardrails on the edges. Have any leaves, snow, or ice cleared regularly. Use sand or salt on walking paths during winter. Clean up any spills in your garage right away. This includes oil or grease spills. What can I do in the bathroom? Use night lights. Install grab bars by the toilet and in the tub and shower.  Do not use towel bars as grab bars. Use non-skid mats or decals in the tub or shower. If you need to sit down in the shower, use a plastic, non-slip stool. Keep the floor dry. Clean up any water that spills on the floor as soon as it happens. Remove soap buildup in the tub or shower regularly. Attach bath mats securely with double-sided non-slip rug tape. Do not have throw rugs and other things on the floor that can make you trip. What can I do in the bedroom? Use night lights. Make sure that you have a light by your bed that is easy to reach. Do not use any sheets or blankets that are too big for your bed. They should not hang down onto the floor. Have a firm chair that has side arms. You can use this for support while you get dressed. Do not have throw rugs and other things on the floor that can make you trip. What can I do in the kitchen? Clean up any spills right away. Avoid walking on wet floors. Keep items that you use a lot in easy-to-reach places. If you need to reach something above you, use a strong step stool that has  a grab bar. Keep electrical cords out of the way. Do not use floor polish or wax that makes floors slippery. If you must use wax, use non-skid floor wax. Do not have throw rugs and other things on the floor that can make you trip. What can I do with my stairs? Do not leave any items on the stairs. Make sure that there are handrails on both sides of the stairs and use them. Fix handrails that are broken or loose. Make sure that handrails are as long as the stairways. Check any carpeting to make sure that it is firmly attached to the stairs. Fix any carpet that is loose or worn. Avoid having throw rugs at the top or bottom of the stairs. If you do have throw rugs, attach them to the floor with carpet tape. Make sure that you have a light switch at the top of the stairs and the bottom of the stairs. If you do not have them, ask someone to add them for you. What else  can I do to help prevent falls? Wear shoes that: Do not have high heels. Have rubber bottoms. Are comfortable and fit you well. Are closed at the toe. Do not wear sandals. If you use a stepladder: Make sure that it is fully opened. Do not climb a closed stepladder. Make sure that both sides of the stepladder are locked into place. Ask someone to hold it for you, if possible. Clearly mark and make sure that you can see: Any grab bars or handrails. First and last steps. Where the edge of each step is. Use tools that help you move around (mobility aids) if they are needed. These include: Canes. Walkers. Scooters. Crutches. Turn on the lights when you go into a dark area. Replace any light bulbs as soon as they burn out. Set up your furniture so you have a clear path. Avoid moving your furniture around. If any of your floors are uneven, fix them. If there are any pets around you, be aware of where they are. Review your medicines with your doctor. Some medicines can make you feel dizzy. This can increase your chance of falling. Ask your doctor what other things that you can do to help prevent falls. This information is not intended to replace advice given to you by your health care provider. Make sure you discuss any questions you have with your health care provider. Document Released: 08/10/2009 Document Revised: 03/21/2016 Document Reviewed: 11/18/2014 Elsevier Interactive Patient Education  2017 [employer].

## 2022-12-30 DIAGNOSIS — D2339 Other benign neoplasm of skin of other parts of face: Secondary | ICD-10-CM | POA: Diagnosis not present

## 2022-12-30 DIAGNOSIS — C44319 Basal cell carcinoma of skin of other parts of face: Secondary | ICD-10-CM | POA: Diagnosis not present

## 2023-01-09 ENCOUNTER — Ambulatory Visit
Admission: RE | Admit: 2023-01-09 | Discharge: 2023-01-09 | Disposition: A | Discharge status: Home / Self Care | Payer: Medicare HMO | Source: Ambulatory Visit | Attending: Family Medicine | Admitting: Family Medicine

## 2023-01-09 VITALS — BP 100/65 | HR 68 | Temp 99.1°F | Resp 16 | Ht 63.0 in | Wt 105.0 lb

## 2023-01-09 DIAGNOSIS — J069 Acute upper respiratory infection, unspecified: Secondary | ICD-10-CM

## 2023-01-09 MED ORDER — IPRATROPIUM BROMIDE 0.06 % NA SOLN: 2.0000 | Freq: Four times a day (QID) | NASAL | 12 refills | Status: DC

## 2023-01-09 NOTE — ED Triage Notes (Signed)
Pt c/o sinus pressure & cough x6 days. Denies any fevers,chills or bodyaches.

## 2023-01-09 NOTE — ED Provider Notes (Signed)
[facility]    CSN: [id number] Arrival date & time: 01/09/23  1400      History   Chief Complaint Chief Complaint  Patient presents with   Sinus Pressure    HPI Mary Everett is a 77 y.o. female.   HPI   Mary Everett presents for sneezing and sinus pressure that started Saturday night. On the 3rd day, she started feeling full in the head. She has a sinus infection no that long ago that started the same way.   Took OTC meds without relief and ibuprofen helped at night.    Fever : no  Chills: no Sore throat: no  Cough: yes slightly  Sputum: no Nasal congestion : no  Rhinorrhea: no Myalgias: no Appetite: normal  Hydration: normal  Abdominal pain: no Nausea: no Vomiting: no Diarrhea: No Rash: No Sleep disturbance: yes  Headache: yes     Past Medical History:  Diagnosis Date   Cataract    Hypercholesteremia 02/15/2022   Hypoglycemia    Osteoporosis 11/21/2017   Special screening for malignant neoplasms, colon    Tubal pregnancy     Patient Active Problem List   Diagnosis Date Noted   Cellulitis 10/07/2022   Chest pain 07/17/2022   Encounter for completion of form with patient 03/26/2022   Hypercholesteremia 02/15/2022   Crackling sound in ear 01/27/2022   Special screening for malignant neoplasms, colon    Polyp of colon    Wrist fracture 07/09/2020   Acquired neutropenia ([place]) 01/13/2020   Healthcare maintenance 01/08/2020   Leukopenia 01/08/2020   Thrombocytopenia ([place]) 01/08/2020   Eyelid dermatitis, infectious 01/05/2020   Abscess of buttock 08/15/2019   Colon cancer screening 08/15/2019   Breast cancer screening 08/15/2019   Acute non-recurrent maxillary sinusitis 10/26/2018   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   Insect bite 05/19/2018   Osteoporosis 11/21/2017   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      Home Medications    Prior to Admission medications   Medication Sig Start Date End Date Taking? Authorizing Provider  Biotin 5 MG TABS Take 10 mg by mouth daily.   Yes [provider]  Ca Cit Malate-Cholecalciferol (CALCIUM CITRATE MALATE-VIT D PO) Take 3 tablets by mouth in the morning and at bedtime.   Yes [provider]  Cod Liver Oil CAPS Take 1 capsule by mouth daily.   Yes [provider]  doxycycline (VIBRA-TABS) 100 MG tablet Take 1 tablet (100 mg total) by mouth 2 (two) times daily. 12/04/22  Yes Einar Pheasant, MD  folic acid (FOLVITE) Q000111Q MCG tablet Take 800 mcg by mouth daily.   Yes [provider]  ibuprofen (ADVIL) 800 MG tablet Take 1 tablet (800 mg total) by mouth every 8 (eight) hours as needed. 05/23/21  Yes Fredirick Maudlin, MD  ipratropium (ATROVENT) 0.06 % nasal spray Place 2 sprays into both nostrils 4 (four) times daily. 01/09/23  Yes Olubunmi Rothenberger, DO  Magnesium Oxide 420 (252 Mg) MG TABS Take 420 mg by mouth every evening.   Yes [provider]  Multiple Vitamins-Minerals (PX COMPLETE SENIOR MULTIVITS) TABS Take 1 tablet by mouth daily.   Yes [provider]    Family History Family History  Problem Relation Age of Onset   Cancer Mother 55       lung   Cancer Father 71       brain   Memory loss Sister     Social History Social History   Tobacco Use   Smoking status: Former    Types: Cigarettes    Quit date: 10/17/1972    Years since quitting: 50.2    Smokeless tobacco: Never  Vaping Use   Vaping Use: Never used  Substance Use Topics   Alcohol use: No   Drug use: No     Allergies   Sulfa antibiotics   Review of Systems Review of Systems: negative unless otherwise stated in HPI.      Physical Exam Triage Vital Signs ED Triage Vitals  Enc Vitals Group     BP 01/09/23 1409 100/65     Pulse Rate 01/09/23 1409 68     Resp 01/09/23 1409 16     Temp 01/09/23 1409 99.1 F (37.3 C)     Temp Source 01/09/23 1409 Oral     SpO2 01/09/23 1409 94 %     Weight 01/09/23 1409 105 lb (47.6 kg)     Height 01/09/23 1409 '5\' 3"'$  (1.6 m)     Head Circumference --      Peak Flow --      Pain Score 01/09/23 1408 0     Pain Loc --      Pain Edu? --      Excl. in [place]? --    No data found.  Updated Vital Signs BP 100/65 (BP Location: Left Arm)   Pulse 68   Temp 99.1 F (37.3 C) (Oral)   Resp 16   Ht '5\' 3"'$  (1.6 m)   Wt 47.6 kg   SpO2 94%   BMI 18.60 kg/m   Visual Acuity Right Eye Distance:   Left Eye Distance:   Bilateral Distance:    Right Eye Near:   Left Eye Near:    Bilateral Near:     Physical Exam GEN:     alert, well appearing female in no distress    HENT:  mucus membranes moist, oropharyngeal without lesions or exudate, no tonsillar hypertrophy or erythema, no nasal discharge, bilateral TM normal EYES:   pupils equal and reactive, no scleral injection or discharge NECK:  ROM at baseline, no lymphadenopathy, no meningismus   RESP:  no increased work of breathing, clear to auscultation bilaterally CVS:   regular rate and rhythm Skin:   warm and dry, no rash on visible skin    UC Treatments / Results  Labs (all labs ordered are listed, but only abnormal results are displayed) Labs Reviewed - No data to display  EKG   Radiology No results found.  Procedures Procedures (including critical care time)  Medications Ordered in UC Medications - No data to display  Initial Impression / Assessment and Plan /  UC Course  I have reviewed the triage vital signs and the nursing notes.  Pertinent labs & imaging results that were available during my care of the patient were reviewed by me and considered in my medical decision making (see chart for  details).       Pt is a 77 y.o. female who presents for 6 days of respiratory symptoms. Mary Everett is afebrile here without recent antipyretics. Satting well on room air. Overall pt is well appearing, well hydrated, without respiratory distress. Pulmonary exam is unremarkable.  COVID and influenza testing deferred due to duration of symptoms. History most consistent with viral respiratory illness. Discussed symptomatic treatment.  Explained lack of efficacy of antibiotics in viral disease.  Typical duration of symptoms discussed. Patient to call if symptoms worsen.  Atrovent nasal spray sent to the pharmacy.  Return and ED precautions given and voiced understanding. Discussed MDM, treatment plan and plan for follow-up with patient who agrees with plan.     Final Clinical Impressions(s) / UC Diagnoses   Final diagnoses:  Acute upper respiratory infection     Discharge Instructions      I am glad you are starting to feel better. If your symptoms, worsen over the weekend. Please call and let me know.    Stop by the pharmacy to pick up your prescription nasal spray.   Follow up with your primary care provider as needed.      ED Prescriptions     Medication Sig Dispense Auth. Provider   ipratropium (ATROVENT) 0.06 % nasal spray Place 2 sprays into both nostrils 4 (four) times daily. 15 mL Lyndee Hensen, DO      PDMP not reviewed this encounter.   Lyndee Hensen, DO 01/09/23 1501

## 2023-01-09 NOTE — Discharge Instructions (Addendum)
I am glad you are starting to feel better. If your symptoms, worsen over the weekend. Please call and let me know.    Stop by the pharmacy to pick up your prescription nasal spray.   Follow up with your primary care provider as needed.

## 2023-01-13 DIAGNOSIS — Z882 Allergy status to sulfonamides status: Secondary | ICD-10-CM | POA: Diagnosis not present

## 2023-01-13 DIAGNOSIS — Z87892 Personal history of anaphylaxis: Secondary | ICD-10-CM | POA: Diagnosis not present

## 2023-01-13 DIAGNOSIS — Z809 Family history of malignant neoplasm, unspecified: Secondary | ICD-10-CM | POA: Diagnosis not present

## 2023-01-13 DIAGNOSIS — Z008 Encounter for other general examination: Secondary | ICD-10-CM | POA: Diagnosis not present

## 2023-01-23 ENCOUNTER — Ambulatory Visit (INDEPENDENT_AMBULATORY_CARE_PROVIDER_SITE_OTHER): Payer: Medicare HMO | Admitting: Internal Medicine

## 2023-01-23 VITALS — BP 110/66 | HR 63 | Temp 98.3°F | Resp 16 | Ht 63.0 in | Wt 103.8 lb

## 2023-01-23 DIAGNOSIS — Z13220 Encounter for screening for lipoid disorders: Secondary | ICD-10-CM

## 2023-01-23 DIAGNOSIS — D696 Thrombocytopenia, unspecified: Secondary | ICD-10-CM | POA: Diagnosis not present

## 2023-01-23 DIAGNOSIS — E7800 Pure hypercholesterolemia, unspecified: Secondary | ICD-10-CM

## 2023-01-23 DIAGNOSIS — K5900 Constipation, unspecified: Secondary | ICD-10-CM

## 2023-01-23 DIAGNOSIS — Z0000 Encounter for general adult medical examination without abnormal findings: Secondary | ICD-10-CM | POA: Diagnosis not present

## 2023-01-23 DIAGNOSIS — Z1211 Encounter for screening for malignant neoplasm of colon: Secondary | ICD-10-CM | POA: Diagnosis not present

## 2023-01-23 DIAGNOSIS — L0231 Cutaneous abscess of buttock: Secondary | ICD-10-CM

## 2023-01-23 NOTE — Assessment & Plan Note (Signed)
Physical today 01/23/23.  PAP 12/2016 negative with negative HPV.  Colonoscopy 11/2020 - tubular adenoma x 2.  Recommended f/u colonoscopy in 7 years.  Mammogram 02/12/22 - [facility])

## 2023-01-23 NOTE — Progress Notes (Signed)
Subjective:    Patient ID: Mary Everett, female    DOB: 14-Feb-1946, 77 y.o.   MRN: [id number]  Patient here for  Chief Complaint  Patient presents with   Annual Exam    HPI Here for physical exam. Evaluated 01/09/23 - respiratory symptoms.  Per review of chart, diagnosed with viral infection.  Prescribed atrovent nasal spray.  Reports no cough or increased congestion currently.  Stays active.  No chest pain or sob reported.  Previously evaluated for abscess of buttock.  Better now.  Not a significant issue currently.  Will notify me if a change.  No abdominal pain reported.  Discussed some constipation - intermittent - not often.  Discussed benefiber.     Past Medical History:  Diagnosis Date   Cataract    Hypercholesteremia 02/15/2022   Hypoglycemia    Osteoporosis 11/21/2017   Special screening for malignant neoplasms, colon    Tubal pregnancy    Past Surgical History:  Procedure Laterality Date   CATARACT EXTRACTION W/ INTRAOCULAR LENS IMPLANT Bilateral    COLONOSCOPY WITH PROPOFOL N/A 12/19/2020   Procedure: COLONOSCOPY WITH PROPOFOL;  Surgeon: [name], MD;  Location: [place];  Service: Endoscopy;  Laterality: N/A;  requests early priority 4   ECTOPIC PREGNANCY SURGERY     POLYPECTOMY N/A 12/19/2020   Procedure: POLYPECTOMY;  Surgeon: [name], MD;  Location: [place];  Service: Endoscopy;  Laterality: N/A;   Family History  Problem Relation Age of Onset   Cancer Mother 21       lung   Cancer Father 7       brain   Memory loss Sister    Social History   Socioeconomic History   Marital status: Married    Spouse name: Not on file   Number of children: Not on file   Years of education: Not on file   Highest education level: Not on file  Occupational History   Not on file  Tobacco Use   Smoking status: Former    Types: Cigarettes    Quit date: 10/17/1972    Years since quitting: 50.3   Smokeless tobacco: Never   Vaping Use   Vaping Use: Never used  Substance and Sexual Activity   Alcohol use: No   Drug use: No   Sexual activity: Not on file  Other Topics Concern   Not on file  Social History Narrative   In [place]; with husband; 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    Social Determinants of Health   Financial Resource Strain: Low Risk  (12/09/2022)   Overall Financial Resource Strain (CARDIA)    Difficulty of Paying Living Expenses: Not hard at all  Food Insecurity: No Food Insecurity (12/09/2022)   Hunger Vital Sign    Worried About Running Out of Food in the Last Year: Never true    Ran Out of Food in the Last Year: Never true  Transportation Needs: No Transportation Needs (12/09/2022)   PRAPARE - Hydrologist (Medical): No    Lack of Transportation (Non-Medical): No  Physical Activity: Sufficiently Active (12/09/2022)   Exercise Vital Sign    Days of Exercise per Week: 7 days    Minutes of Exercise per Session: 120 min  Stress: No Stress Concern Present (12/09/2022)   [place]    Feeling of Stress : Not at all  Social  Connections: Socially Integrated (12/09/2022)   Social Connection and Isolation Panel [NHANES]    Frequency of Communication with Friends and Family: More than three times a week    Frequency of Social Gatherings with Friends and Family: More than three times a week    Attends Religious Services: 1 to 4 times per year    Active Member of [employer] or Organizations: Yes    Attends Archivist Meetings: Not on file    Marital Status: Married     Review of Systems  Constitutional:  Negative for appetite change and unexpected weight change.  HENT:  Negative for congestion and sinus pressure.   Respiratory:  Negative for cough, chest tightness and shortness of breath.   Cardiovascular:  Negative for chest pain and  palpitations.  Gastrointestinal:  Negative for abdominal pain, diarrhea, nausea and vomiting.  Genitourinary:  Negative for difficulty urinating and dysuria.  Musculoskeletal:  Negative for joint swelling and myalgias.  Skin:  Negative for color change and rash.  Neurological:  Negative for dizziness and headaches.  Psychiatric/Behavioral:  Negative for agitation and dysphoric mood.        Objective:     BP 110/66   Pulse 63   Temp 98.3 F (36.8 C)   Resp 16   Ht 5\' 3"  (1.6 m)   Wt 103 lb 12.8 oz (47.1 kg)   SpO2 98%   BMI 18.39 kg/m  Wt Readings from Last 3 Encounters:  01/23/23 103 lb 12.8 oz (47.1 kg)  01/09/23 105 lb (47.6 kg)  12/09/22 102 lb (46.3 kg)    Physical Exam Vitals reviewed.  Constitutional:      General: She is not in acute distress.    Appearance: Normal appearance. She is well-developed.  HENT:     Head: Normocephalic and atraumatic.     Right Ear: External ear normal.     Left Ear: External ear normal.  Eyes:     General: No scleral icterus.       Right eye: No discharge.        Left eye: No discharge.     Conjunctiva/sclera: Conjunctivae normal.  Neck:     Thyroid: No thyromegaly.  Cardiovascular:     Rate and Rhythm: Normal rate and regular rhythm.  Pulmonary:     Effort: No tachypnea, accessory muscle usage or respiratory distress.     Breath sounds: Normal breath sounds. No decreased breath sounds or wheezing.  Chest:  Breasts:    Right: No inverted nipple, mass, nipple discharge or tenderness (no axillary adenopathy).     Left: No inverted nipple, mass, nipple discharge or tenderness (no axilarry adenopathy).  Abdominal:     General: Bowel sounds are normal.     Palpations: Abdomen is soft.     Tenderness: There is no abdominal tenderness.  Musculoskeletal:        General: No swelling or tenderness.     Cervical back: Neck supple.  Lymphadenopathy:     Cervical: No cervical adenopathy.  Skin:    Findings: No erythema or rash.   Neurological:     Mental Status: She is alert and oriented to person, place, and time.  Psychiatric:        Mood and Affect: Mood normal.        Behavior: Behavior normal.      Outpatient Encounter Medications as of 01/23/2023  Medication Sig   Biotin 5 MG TABS Take 10 mg by mouth daily.   Ca Cit Malate-Cholecalciferol (  CALCIUM CITRATE MALATE-VIT D PO) Take 3 tablets by mouth in the morning and at bedtime.   Cod Liver Oil CAPS Take 1 capsule by mouth daily.   ibuprofen (ADVIL) 800 MG tablet Take 1 tablet (800 mg total) by mouth every 8 (eight) hours as needed.   ipratropium (ATROVENT) 0.06 % nasal spray Place 2 sprays into both nostrils 4 (four) times daily.   Magnesium Oxide 420 (252 Mg) MG TABS Take 420 mg by mouth every evening.   Multiple Vitamins-Minerals (PX COMPLETE SENIOR MULTIVITS) TABS Take 1 tablet by mouth daily.   [DISCONTINUED] doxycycline (VIBRA-TABS) 100 MG tablet Take 1 tablet (100 mg total) by mouth 2 (two) times daily.   [DISCONTINUED] folic acid (FOLVITE) Q000111Q MCG tablet Take 800 mcg by mouth daily.   No facility-administered encounter medications on file as of 01/23/2023.     Lab Results  Component Value Date   WBC 4.7 02/26/2022   HGB 13.1 02/26/2022   HCT 40.4 02/26/2022   PLT 131.0 (L) 02/26/2022   GLUCOSE 81 02/14/2022   CHOL 208 (H) 02/14/2022   TRIG 40.0 02/14/2022   HDL 82.60 02/14/2022   LDLCALC 117 (H) 02/14/2022   ALT 20 02/14/2022   AST 26 02/14/2022   NA 135 02/14/2022   K 3.8 02/14/2022   CL 99 02/14/2022   CREATININE 0.55 02/14/2022   BUN 23 02/14/2022   CO2 32 02/14/2022   TSH 2.68 05/30/2021   HGBA1C 5.6 05/24/2012    No results found.     Assessment & Plan:  Routine general medical examination at a health care facility  Healthcare maintenance Assessment & Plan: Physical today 01/23/23.  PAP 12/2016 negative with negative HPV.  Colonoscopy 11/2020 - tubular adenoma x 2.  Recommended f/u colonoscopy in 7 years.  Mammogram 02/12/22  - [facility])   Screening cholesterol level -     Hepatic function panel; Future -     Lipid panel; Future  Thrombocytopenia (HCC) Assessment & Plan: Follow cbc.   Orders: -     Basic metabolic panel; Future -     CBC with Differential/Platelet; Future -     TSH; Future  Abscess of buttock Assessment & Plan: Not a significant issue for her now.  Will notify me if any problems.    Colon cancer screening Assessment & Plan: Colonoscopy 12/19/20 -Two 5 to 6 mm polyps in the descending colon and in the ascending colon, removed with a cold snare. Resected and retrieved. The examination was otherwise normal. The rectum, sigmoid colon, descending colon, transverse colon, ascending colon and cecum are normal. The distal rectum and anal verge are normal on retroflexion view.    Hypercholesteremia Assessment & Plan: Follow lipid panel.    Constipation, unspecified constipation type Assessment & Plan: Not a significant issue for her.  Discussed staying hydrated and remaining active.  Also discussed benefiber daily.  Follow.       Einar Pheasant, MD

## 2023-01-25 ENCOUNTER — Encounter: Payer: Self-pay | Admitting: Internal Medicine

## 2023-01-25 DIAGNOSIS — K5900 Constipation, unspecified: Secondary | ICD-10-CM | POA: Insufficient documentation

## 2023-01-25 NOTE — Assessment & Plan Note (Signed)
Not a significant issue for her.  Discussed staying hydrated and remaining active.  Also discussed benefiber daily.  Follow.

## 2023-01-25 NOTE — Assessment & Plan Note (Signed)
Follow lipid panel.   

## 2023-01-25 NOTE — Assessment & Plan Note (Signed)
Not a significant issue for her now.  Will notify me if any problems.

## 2023-01-25 NOTE — Assessment & Plan Note (Signed)
Colonoscopy 12/19/20 -Two 5 to 6 mm polyps in the descending colon and in the ascending colon, removed with a cold snare. Resected and retrieved. The examination was otherwise normal. The rectum, sigmoid colon, descending colon, transverse colon, ascending colon and cecum are normal. The distal rectum and anal verge are normal on retroflexion view.  

## 2023-01-25 NOTE — Assessment & Plan Note (Signed)
Follow cbc.  

## 2023-02-18 ENCOUNTER — Encounter: Payer: Self-pay | Admitting: Internal Medicine

## 2023-02-27 ENCOUNTER — Other Ambulatory Visit
Admission: RE | Admit: 2023-02-27 | Discharge: 2023-02-27 | Disposition: A | Discharge status: Home / Self Care | Payer: Medicare HMO | Attending: Internal Medicine | Admitting: Internal Medicine

## 2023-02-27 DIAGNOSIS — D696 Thrombocytopenia, unspecified: Secondary | ICD-10-CM | POA: Insufficient documentation

## 2023-02-27 DIAGNOSIS — E7800 Pure hypercholesterolemia, unspecified: Secondary | ICD-10-CM | POA: Diagnosis not present

## 2023-02-27 DIAGNOSIS — Z13220 Encounter for screening for lipoid disorders: Secondary | ICD-10-CM | POA: Insufficient documentation

## 2023-02-27 LAB — CBC WITH DIFFERENTIAL/PLATELET
Abs Immature Granulocytes: 0.01 10*3/uL (ref 0.00–0.07)
Basophils Absolute: 0.1 10*3/uL (ref 0.0–0.1)
Basophils Relative: 2 %
Eosinophils Absolute: 0 10*3/uL (ref 0.0–0.5)
Eosinophils Relative: 1 %
HCT: 39.9 % (ref 36.0–46.0)
Hemoglobin: 13 g/dL (ref 12.0–15.0)
Immature Granulocytes: 0 %
Lymphocytes Relative: 52 %
Lymphs Abs: 2 10*3/uL (ref 0.7–4.0)
MCH: 27.4 pg (ref 26.0–34.0)
MCHC: 32.6 g/dL (ref 30.0–36.0)
MCV: 84 fL (ref 80.0–100.0)
Monocytes Absolute: 0.8 10*3/uL (ref 0.1–1.0)
Monocytes Relative: 21 %
Neutro Abs: 0.9 10*3/uL — ABNORMAL LOW (ref 1.7–7.7)
Neutrophils Relative %: 24 %
Platelets: 180 10*3/uL (ref 150–400)
RBC: 4.75 MIL/uL (ref 3.87–5.11)
RDW: 15.8 % — ABNORMAL HIGH (ref 11.5–15.5)
Smear Review: ADEQUATE
WBC: 3.9 10*3/uL — ABNORMAL LOW (ref 4.0–10.5)
nRBC: 0 % (ref 0.0–0.2)

## 2023-02-27 LAB — HEPATIC FUNCTION PANEL
ALT: 26 U/L (ref 0–44)
AST: 30 U/L (ref 15–41)
Albumin: 3.9 g/dL (ref 3.5–5.0)
Alkaline Phosphatase: 56 U/L (ref 38–126)
Bilirubin, Direct: <0.1 mg/dL (ref 0.0–0.2)
Total Bilirubin: 0.5 mg/dL (ref 0.3–1.2)
Total Protein: 6.5 g/dL (ref 6.5–8.1)

## 2023-02-27 LAB — LIPID PANEL
Cholesterol: 254 mg/dL — ABNORMAL HIGH (ref 0–200)
HDL: 99 mg/dL (ref >40)
LDL Cholesterol: 146 mg/dL — ABNORMAL HIGH (ref 0–99)
Total CHOL/HDL Ratio: 2.6 RATIO
Triglycerides: 45 mg/dL (ref <150)
VLDL: 9 mg/dL (ref 0–40)

## 2023-02-27 LAB — BASIC METABOLIC PANEL
Anion gap: 6 (ref 5–15)
BUN: 22 mg/dL (ref 8–23)
CO2: 29 mmol/L (ref 22–32)
Calcium: 8.5 mg/dL — ABNORMAL LOW (ref 8.9–10.3)
Chloride: 99 mmol/L (ref 98–111)
Creatinine, Ser: 0.53 mg/dL (ref 0.44–1.00)
GFR, Estimated: >60 mL/min (ref >60)
Glucose, Bld: 84 mg/dL (ref 70–99)
Potassium: 3.7 mmol/L (ref 3.5–5.1)
Sodium: 134 mmol/L — ABNORMAL LOW (ref 135–145)

## 2023-02-27 LAB — TSH: TSH: 2.206 u[IU]/mL (ref 0.350–4.500)

## 2023-02-28 ENCOUNTER — Other Ambulatory Visit: Payer: Self-pay

## 2023-02-28 ENCOUNTER — Telehealth: Payer: Self-pay

## 2023-02-28 DIAGNOSIS — R7989 Other specified abnormal findings of blood chemistry: Secondary | ICD-10-CM

## 2023-02-28 DIAGNOSIS — E871 Hypo-osmolality and hyponatremia: Secondary | ICD-10-CM

## 2023-02-28 NOTE — Telephone Encounter (Signed)
LMTCB. OK to give results  

## 2023-02-28 NOTE — Telephone Encounter (Signed)
-----   Message from Dale [place], MD sent at 02/28/2023  3:57 AM EDT ----- Notify - cholesterol has increased from previous check. Continue diet and exercise.  It is recommended for her to be on a cholesterol medication.  If agreeable, would like to start crestor 10mg  q day.  Will need liver panel checked 6 weeks after starting the medication.  White blood cell count  - slightly decreased (just slight).  We will follow. Hgb and platelet count wnl.  Recheck cbc in 3-4 weeks. Sodium slightly decreased as well.  (Just slight).  Recheck sodium level in 3-4 weeks. Thyroid test, kidney function tests and liver funtion tests are wnl.

## 2023-02-28 NOTE — Telephone Encounter (Signed)
See results note. 

## 2023-02-28 NOTE — Telephone Encounter (Signed)
Pt called back and she saw the results on mychart but she still wanted to talk to the cma

## 2023-03-04 DIAGNOSIS — Z1231 Encounter for screening mammogram for malignant neoplasm of breast: Secondary | ICD-10-CM | POA: Diagnosis not present

## 2023-03-04 LAB — HM MAMMOGRAPHY

## 2023-03-05 ENCOUNTER — Encounter: Payer: Self-pay | Admitting: Internal Medicine

## 2023-03-05 NOTE — Telephone Encounter (Signed)
See result note.  

## 2023-03-19 DIAGNOSIS — R928 Other abnormal and inconclusive findings on diagnostic imaging of breast: Secondary | ICD-10-CM | POA: Diagnosis not present

## 2023-03-28 NOTE — Progress Notes (Signed)
Dx E78.00 should cover both of these labs.  Thanks

## 2023-03-28 NOTE — Addendum Note (Signed)
Encounter addended by: Dale [place], MD on: 03/28/2023 12:39 PM  Actions taken: Clinical Note Signed

## 2023-04-06 DIAGNOSIS — R059 Cough, unspecified: Secondary | ICD-10-CM | POA: Diagnosis not present

## 2023-04-06 DIAGNOSIS — J209 Acute bronchitis, unspecified: Secondary | ICD-10-CM | POA: Diagnosis not present

## 2023-05-15 DIAGNOSIS — H0014 Chalazion left upper eyelid: Secondary | ICD-10-CM | POA: Diagnosis not present

## 2023-05-18 ENCOUNTER — Ambulatory Visit
Admission: EM | Admit: 2023-05-18 | Discharge: 2023-05-18 | Disposition: A | Discharge status: Home / Self Care | Payer: Medicare HMO | Attending: Internal Medicine | Admitting: Internal Medicine

## 2023-05-18 DIAGNOSIS — H00014 Hordeolum externum left upper eyelid: Secondary | ICD-10-CM | POA: Diagnosis not present

## 2023-05-18 MED ORDER — AMOXICILLIN-POT CLAVULANATE 875-125 MG PO TABS: 1.0000 | ORAL_TABLET | Freq: Two times a day (BID) | ORAL | 0 refills | Status: DC

## 2023-05-18 NOTE — ED Provider Notes (Signed)
[facility]    CSN: [id number] Arrival date & time: 05/18/23  0934      History   Chief Complaint Chief Complaint  Patient presents with   Eye Problem    HPI Mary Everett is a 77 y.o. female presents to urgent care today with complaint of a stye of her left upper eyelid.  She noticed this 8 days ago.  She reports discomfort in the left upper eyelid.  The stye seems to be getting larger in size.  She has not noticed any drainage from the area.  She denies vision changes.  She was seen for the same by her eye doctor who put her on eyedrops however she reports this has not seem to help.  She did also take some leftover doxycycline of her husband's which is also not seem to help.  She reports her husband has eye surgery tomorrow and she needs this to be better by then.  She reports she has been on oral antibiotics in the past for the same and that seems to be the only thing that helps to resolve her styes.  HPI  Past Medical History:  Diagnosis Date   Cataract    Hypercholesteremia 02/15/2022   Hypoglycemia    Osteoporosis 11/21/2017   Special screening for malignant neoplasms, colon    Tubal pregnancy     Patient Active Problem List   Diagnosis Date Noted   Constipation 01/25/2023   Cellulitis 10/07/2022   Chest pain 07/17/2022   Encounter for completion of form with patient 03/26/2022   Hypercholesteremia 02/15/2022   Crackling sound in ear 01/27/2022   Special screening for malignant neoplasms, colon    Polyp of colon    Wrist fracture 07/09/2020   Acquired neutropenia (HCC) 01/13/2020   Healthcare maintenance 01/08/2020   Leukopenia 01/08/2020   Thrombocytopenia (HCC) 01/08/2020   Eyelid dermatitis, infectious 01/05/2020   Abscess of buttock 08/15/2019   Colon cancer screening 08/15/2019   Breast cancer screening 08/15/2019   Acute non-recurrent maxillary sinusitis 10/26/2018   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;  Service: Endoscopy;  Laterality: N/A;    OB History   No obstetric history on file.      Home Medications    Prior to Admission medications   Medication Sig Start Date End Date Taking? Authorizing Provider  amoxicillin-clavulanate (AUGMENTIN) 875-125 MG tablet Take 1 tablet by mouth 2 (two) times daily. 05/18/23  Yes Lorre Munroe, NP  Biotin 5 MG TABS Take 10 mg by mouth daily.    [provider]  Ca Cit Malate-Cholecalciferol (CALCIUM CITRATE MALATE-VIT D PO) Take 3 tablets by mouth in the morning and at bedtime.    [provider]  [facility] Liver Oil CAPS Take 1 capsule by mouth daily.    [provider]  ibuprofen (ADVIL) 800 MG tablet Take 1 tablet (800 mg total) by mouth every 8 (eight) hours as needed. 05/23/21  Duanne Guess, MD  ipratropium (ATROVENT) 0.06 % nasal spray Place 2 sprays into both nostrils 4 (four) times daily. 01/09/23   Katha Cabal, DO  Magnesium Oxide 420 (252 Mg) MG TABS Take 420 mg by mouth every evening.    [provider]  Multiple Vitamins-Minerals (PX COMPLETE SENIOR MULTIVITS) TABS Take 1 tablet by mouth daily.    [provider]    Family History Family History  Problem Relation Age of Onset   Cancer Mother 61       lung   Cancer Father 65       brain   Memory loss Sister     Social History Social History   Tobacco Use   Smoking status: Former    Current  packs/day: 0.00    Types: Cigarettes    Quit date: 10/17/1972    Years since quitting: 50.6   Smokeless tobacco: Never  Vaping Use   Vaping status: Never Used  Substance Use Topics   Alcohol use: No   Drug use: No     Allergies   Neomycin-polymyxin-dexameth and Sulfa antibiotics   Review of Systems Review of Systems  Constitutional:  Negative for chills and fever.  HENT:         Patient reports lesion of left upper eyelid  Eyes:  Negative for photophobia, pain, redness and visual disturbance.  Neurological:  Negative for dizziness, weakness and headaches.     Physical Exam Triage Vital Signs ED Triage Vitals  Encounter Vitals Group     BP 05/18/23 0954 116/62     Systolic BP Percentile --      Diastolic BP Percentile --      Pulse Rate 05/18/23 0954 67     Resp 05/18/23 0954 18     Temp 05/18/23 0954 98.5 F (36.9 C)     Temp Source 05/18/23 0954 Oral     SpO2 05/18/23 0954 97 %     Weight --      Height --      Head Circumference --      Peak Flow --      Pain Score 05/18/23 0953 0     Pain Loc --      Pain Education --      Exclude from Growth Chart --    No data found.  Updated Vital Signs BP 116/62 (BP Location: Left Arm)   Pulse 67   Temp 98.5 F (36.9 C) (Oral)   Resp 18   SpO2 97%      Physical Exam Constitutional:      General: She is not in acute distress.    Appearance: She is normal weight.  HENT:     Head: Normocephalic.  Eyes:     Extraocular Movements: Extraocular movements intact.     Conjunctiva/sclera: Conjunctivae normal.     Pupils: Pupils are equal, round, and reactive to light.     Comments: Stye noted of left upper eyelid  Cardiovascular:     Rate and Rhythm: Normal rate and regular rhythm.     Heart sounds: Normal heart sounds.  Pulmonary:     Effort: Pulmonary effort is normal.     Breath sounds: Normal breath sounds.  Lymphadenopathy:     Cervical: No cervical adenopathy.  Neurological:     General: No focal  deficit present.     Mental Status: She is alert and oriented to person, place, and time.      UC Treatments / Results  Labs   EKG   Radiology No results found.  Procedures Procedures (including critical care time)  Medications Ordered in UC Medications - No data to display  Initial Impression / Assessment and Plan / UC Course  I have reviewed the triage vital signs and the nursing notes.  Pertinent labs & imaging results that were available during my care of the patient were reviewed by me and considered in my medical decision making (see chart for details).     77 year old female with 8-day history of stye to her left upper eyelid.  This has not been responsive to antibiotic eyedrops or oral doxycycline.  She is requesting an alternative oral antibiotic.  Advised her that typically this is treated with antibiotic eyedrops and if not successful then typically drainage by the ophthalmologist.  Will provide Rx for Augmentin 875-125 mg twice daily x 10 days.  Encouraged warm compresses.  Advised her to follow-up with the ophthalmologist next week if the symptoms persist or worsen.  Final Clinical Impressions(s) / UC Diagnoses   Final diagnoses:  Hordeolum externum of left upper eyelid     Discharge Instructions      You were seen today for a persistent stye of your left upper eyelid.  I encouraged warm compresses 3 times daily.  I am putting you on antibiotics twice daily for the next 10 days.  Please follow-up with your ophthalmologist if symptoms persist or worsen as this may need to be drained.     ED Prescriptions     Medication Sig Dispense Auth. Provider   amoxicillin-clavulanate (AUGMENTIN) 875-125 MG tablet Take 1 tablet by mouth 2 (two) times daily. 20 tablet Lorre Munroe, NP      PDMP not reviewed this encounter.   Lorre Munroe, NP 05/18/23 1015

## 2023-05-18 NOTE — Discharge Instructions (Signed)
You were seen today for a persistent stye of your left upper eyelid.  I encouraged warm compresses 3 times daily.  I am putting you on antibiotics twice daily for the next 10 days.  Please follow-up with your ophthalmologist if symptoms persist or worsen as this may need to be drained.

## 2023-05-18 NOTE — ED Triage Notes (Signed)
Patient presents to UC for eye problem x 8 days. Stye on left eyelid. States she was seen by eye doc on Tuesday, treated with eye drops, states did not tolerate well so she stopped it and started to take husbands left over doxy.

## 2023-05-30 DIAGNOSIS — Z961 Presence of intraocular lens: Secondary | ICD-10-CM | POA: Diagnosis not present

## 2023-05-30 DIAGNOSIS — H0014 Chalazion left upper eyelid: Secondary | ICD-10-CM | POA: Diagnosis not present

## 2023-06-12 ENCOUNTER — Encounter (INDEPENDENT_AMBULATORY_CARE_PROVIDER_SITE_OTHER): Payer: Self-pay

## 2023-07-28 ENCOUNTER — Ambulatory Visit: Payer: Medicare HMO | Admitting: Internal Medicine

## 2023-08-21 ENCOUNTER — Encounter: Payer: Self-pay | Admitting: Internal Medicine

## 2023-08-21 ENCOUNTER — Ambulatory Visit (INDEPENDENT_AMBULATORY_CARE_PROVIDER_SITE_OTHER): Payer: Medicare HMO | Admitting: Internal Medicine

## 2023-08-21 VITALS — BP 106/70 | HR 71 | Temp 97.9°F | Resp 16 | Ht 63.0 in | Wt 103.0 lb

## 2023-08-21 DIAGNOSIS — E7800 Pure hypercholesterolemia, unspecified: Secondary | ICD-10-CM | POA: Diagnosis not present

## 2023-08-21 DIAGNOSIS — D72819 Decreased white blood cell count, unspecified: Secondary | ICD-10-CM

## 2023-08-21 DIAGNOSIS — Z1211 Encounter for screening for malignant neoplasm of colon: Secondary | ICD-10-CM

## 2023-08-21 DIAGNOSIS — D696 Thrombocytopenia, unspecified: Secondary | ICD-10-CM | POA: Diagnosis not present

## 2023-08-21 DIAGNOSIS — Z1231 Encounter for screening mammogram for malignant neoplasm of breast: Secondary | ICD-10-CM

## 2023-08-21 NOTE — Progress Notes (Signed)
Subjective:    Patient ID: Mary Everett, female    DOB: April 29, 1946, 77 y.o.   MRN: [id number]  Patient here for  Chief Complaint  Patient presents with   Medical Management of Chronic Issues    HPI Here to follow up regarding hypercholesterolemia. She stays active.  No chest pain or sob reported. No increased cough or congestion.  No abdominal pain or cramping.  No bowel change.    Past Medical History:  Diagnosis Date   Cataract    Hypercholesteremia 02/15/2022   Hypoglycemia    Osteoporosis 11/21/2017   Special screening for malignant neoplasms, colon    Tubal pregnancy    Past Surgical History:  Procedure Laterality Date   CATARACT EXTRACTION W/ INTRAOCULAR LENS IMPLANT Bilateral    COLONOSCOPY WITH PROPOFOL N/A 12/19/2020   Procedure: COLONOSCOPY WITH PROPOFOL;  Surgeon: Pasty Spillers, MD;  Location: [facility];  Service: Endoscopy;  Laterality: N/A;  requests early priority 4   ECTOPIC PREGNANCY SURGERY     POLYPECTOMY N/A 12/19/2020   Procedure: POLYPECTOMY;  Surgeon: Pasty Spillers, MD;  Location: [facility];  Service: Endoscopy;  Laterality: N/A;   Family History  Problem Relation Age of Onset   Cancer Mother 47       lung   Cancer Father 53       brain   Memory loss Sister    Social History   Socioeconomic History   Marital status: Married    Spouse name: Not on file   Number of children: Not on file   Years of education: Not on file   Highest education level: Not on file  Occupational History   Not on file  Tobacco Use   Smoking status: Former    Current packs/day: 0.00    Types: Cigarettes    Quit date: 10/17/1972    Years since quitting: 50.8   Smokeless tobacco: Never  Vaping Use   Vaping status: Never Used  Substance and Sexual Activity   Alcohol use: No   Drug use: No   Sexual activity: Not on file  Other Topics Concern   Not on file  Social History Narrative   In [place]; with husband; worked as  Journalist, 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    Social Determinants of Health   Financial Resource Strain: Low Risk  (12/09/2022)   Overall Financial Resource Strain (CARDIA)    Difficulty of Paying Living Expenses: Not hard at all  Food Insecurity: No Food Insecurity (12/09/2022)   Hunger Vital Sign    Worried About Running Out of Food in the Last Year: Never true    Ran Out of Food in the Last Year: Never true  Transportation Needs: No Transportation Needs (12/09/2022)   PRAPARE - Administrator, Civil Service (Medical): No    Lack of Transportation (Non-Medical): No  Physical Activity: Sufficiently Active (12/09/2022)   Exercise Vital Sign    Days of Exercise per Week: 7 days    Minutes of Exercise per Session: 120 min  Stress: No Stress Concern Present (12/09/2022)   [employer] of Occupational Health - Occupational Stress Questionnaire    Feeling of Stress : Not at all  Social Connections: Socially Integrated (12/09/2022)   Social Connection and Isolation Panel [NHANES]    Frequency of Communication with Friends and Family: More than three times a week    Frequency of Social Gatherings with Friends and Family: More than three  times a week    Attends Religious Services: 1 to 4 times per year    Active Member of Clubs or Organizations: Yes    Attends Banker Meetings: Not on file    Marital Status: Married     Review of Systems  Constitutional:  Negative for appetite change and unexpected weight change.  HENT:  Negative for congestion and sinus pressure.   Respiratory:  Negative for cough, chest tightness and shortness of breath.   Cardiovascular:  Negative for chest pain and palpitations.  Gastrointestinal:  Negative for abdominal pain, diarrhea, nausea and vomiting.  Genitourinary:  Negative for difficulty urinating and dysuria.  Musculoskeletal:  Negative for joint swelling and myalgias.  Skin:  Negative for color  change and rash.  Neurological:  Negative for dizziness and headaches.  Psychiatric/Behavioral:  Negative for agitation and dysphoric mood.        Objective:     BP 106/70   Pulse 71   Temp 97.9 F (36.6 C)   Resp 16   Ht 5\' 3"  (1.6 m)   Wt 103 lb (46.7 kg)   SpO2 97%   BMI 18.25 kg/m  Wt Readings from Last 3 Encounters:  08/21/23 103 lb (46.7 kg)  01/23/23 103 lb 12.8 oz (47.1 kg)  01/09/23 105 lb (47.6 kg)    Physical Exam Vitals reviewed.  Constitutional:      General: She is not in acute distress.    Appearance: Normal appearance.  HENT:     Head: Normocephalic and atraumatic.     Right Ear: External ear normal.     Left Ear: External ear normal.  Eyes:     General: No scleral icterus.       Right eye: No discharge.        Left eye: No discharge.     Conjunctiva/sclera: Conjunctivae normal.  Neck:     Thyroid: No thyromegaly.  Cardiovascular:     Rate and Rhythm: Normal rate and regular rhythm.  Pulmonary:     Effort: No respiratory distress.     Breath sounds: Normal breath sounds. No wheezing.  Abdominal:     General: Bowel sounds are normal.     Palpations: Abdomen is soft.     Tenderness: There is no abdominal tenderness.  Musculoskeletal:        General: No swelling or tenderness.     Cervical back: Neck supple. No tenderness.  Lymphadenopathy:     Cervical: No cervical adenopathy.  Skin:    Findings: No erythema or rash.  Neurological:     Mental Status: She is alert.  Psychiatric:        Mood and Affect: Mood normal.        Behavior: Behavior normal.      Outpatient Encounter Medications as of 08/21/2023  Medication Sig   Biotin 5 MG TABS Take 10 mg by mouth daily.   Ca Cit Malate-Cholecalciferol (CALCIUM CITRATE MALATE-VIT D PO) Take 3 tablets by mouth in the morning and at bedtime.   Cod Liver Oil CAPS Take 1 capsule by mouth daily.   ibuprofen (ADVIL) 800 MG tablet Take 1 tablet (800 mg total) by mouth every 8 (eight) hours as  needed.   ipratropium (ATROVENT) 0.06 % nasal spray Place 2 sprays into both nostrils 4 (four) times daily.   Magnesium Oxide 420 (252 Mg) MG TABS Take 420 mg by mouth every evening.   Multiple Vitamins-Minerals (PX COMPLETE SENIOR MULTIVITS) TABS Take 1 tablet by  mouth daily.   [DISCONTINUED] amoxicillin-clavulanate (AUGMENTIN) 875-125 MG tablet Take 1 tablet by mouth 2 (two) times daily.   No facility-administered encounter medications on file as of 08/21/2023.     Lab Results  Component Value Date   WBC 3.9 (L) 02/27/2023   HGB 13.0 02/27/2023   HCT 39.9 02/27/2023   PLT 180 02/27/2023   GLUCOSE 84 02/27/2023   CHOL 254 (H) 02/27/2023   TRIG 45 02/27/2023   HDL 99 02/27/2023   LDLCALC 146 (H) 02/27/2023   ALT 26 02/27/2023   AST 30 02/27/2023   NA 134 (L) 02/27/2023   K 3.7 02/27/2023   CL 99 02/27/2023   CREATININE 0.53 02/27/2023   BUN 22 02/27/2023   CO2 29 02/27/2023   TSH 2.206 02/27/2023   HGBA1C 5.6 05/24/2012       Assessment & Plan:  Thrombocytopenia (HCC) Assessment & Plan: Follow cbc.    Leukopenia, unspecified type Assessment & Plan: Followed by hematology.    Hypercholesteremia Assessment & Plan: Follow lipid panel.    Encounter for screening mammogram for malignant neoplasm of breast Assessment & Plan: 02/27/23 - screening mammogram recommended f/u views right breast.  03/19/23 - right breast mammogram - birads II.  Recommend f/u screening mammogram in one year.    Colon cancer screening Assessment & Plan: Colonoscopy 12/19/20 -Two 5 to 6 mm polyps in the descending colon and in the ascending colon, removed with a cold snare. Resected and retrieved. The examination was otherwise normal. The rectum, sigmoid colon, descending colon, transverse colon, ascending colon and cecum are normal. The distal rectum and anal verge are normal on retroflexion view.       Dale Varnado, MD

## 2023-08-24 ENCOUNTER — Encounter: Payer: Self-pay | Admitting: Internal Medicine

## 2023-08-24 NOTE — Assessment & Plan Note (Signed)
Followed by hematology 

## 2023-08-24 NOTE — Assessment & Plan Note (Signed)
02/27/23 - screening mammogram recommended f/u views right breast.  03/19/23 - right breast mammogram - birads II.  Recommend f/u screening mammogram in one year.

## 2023-08-24 NOTE — Assessment & Plan Note (Signed)
Follow lipid panel.   

## 2023-08-24 NOTE — Assessment & Plan Note (Signed)
Follow cbc.  

## 2023-08-24 NOTE — Assessment & Plan Note (Signed)
Colonoscopy 12/19/20 -Two 5 to 6 mm polyps in the descending colon and in the ascending colon, removed with a cold snare. Resected and retrieved. The examination was otherwise normal. The rectum, sigmoid colon, descending colon, transverse colon, ascending colon and cecum are normal. The distal rectum and anal verge are normal on retroflexion view.

## 2023-11-18 DIAGNOSIS — D2262 Melanocytic nevi of left upper limb, including shoulder: Secondary | ICD-10-CM | POA: Diagnosis not present

## 2023-11-18 DIAGNOSIS — Z85828 Personal history of other malignant neoplasm of skin: Secondary | ICD-10-CM | POA: Diagnosis not present

## 2023-11-18 DIAGNOSIS — D2271 Melanocytic nevi of right lower limb, including hip: Secondary | ICD-10-CM | POA: Diagnosis not present

## 2023-11-18 DIAGNOSIS — D0462 Carcinoma in situ of skin of left upper limb, including shoulder: Secondary | ICD-10-CM | POA: Diagnosis not present

## 2023-11-18 DIAGNOSIS — D485 Neoplasm of uncertain behavior of skin: Secondary | ICD-10-CM | POA: Diagnosis not present

## 2023-11-18 DIAGNOSIS — D2272 Melanocytic nevi of left lower limb, including hip: Secondary | ICD-10-CM | POA: Diagnosis not present

## 2024-01-26 ENCOUNTER — Ambulatory Visit (INDEPENDENT_AMBULATORY_CARE_PROVIDER_SITE_OTHER): Payer: Medicare HMO | Admitting: *Deleted

## 2024-01-26 VITALS — Ht 63.0 in | Wt 105.0 lb

## 2024-01-26 DIAGNOSIS — Z0000 Encounter for general adult medical examination without abnormal findings: Secondary | ICD-10-CM

## 2024-01-26 NOTE — Patient Instructions (Signed)
 Mary Everett , Thank you for taking time to come for your Medicare Wellness Visit. I appreciate your ongoing commitment to your health goals. Please review the following plan we discussed and let me know if I can assist you in the future.   Referrals/Orders/Follow-Ups/Clinician Recommendations: Your flu shot has been updated.  This is a list of the screening recommended for you and due dates:  Health Maintenance  Topic Date Due   COVID-19 Vaccine (4 - 2024-25 season) 06/29/2023   Mammogram  03/03/2024   Medicare Annual Wellness Visit  01/25/2025   DTaP/Tdap/Td vaccine (2 - Tdap) 05/21/2026   Colon Cancer Screening  12/20/2027   Pneumonia Vaccine  Completed   Flu Shot  Completed   DEXA scan (bone density measurement)  Completed   Hepatitis C Screening  Completed   Zoster (Shingles) Vaccine  Completed   HPV Vaccine  Aged Out    Advanced directives: (Copy Requested) Please bring a copy of your health care power of attorney and living will to the office to be added to your chart at your convenience. You can mail to [facility] [address] [place], [place] [address] or email to [email or web address] Patient has the paperwork and requested that she complete it and bring to the office.  Next Medicare Annual Wellness Visit scheduled for next year: Yes 01/31/25 @ 8:50

## 2024-01-26 NOTE — Progress Notes (Signed)
 Subjective:   Mary Everett is a 78 y.o. who presents for a Medicare Wellness preventive visit.  Visit Complete: Virtual I connected with  Mary Everett on 01/26/24 by a audio enabled telemedicine application and verified that I am speaking with the correct person using two identifiers.  Patient Location: Home  Provider Location: Office/Clinic  I discussed the limitations of evaluation and management by telemedicine. The patient expressed understanding and agreed to proceed.  Vital Signs: Because this visit was a virtual/telehealth visit, some criteria may be missing or patient reported. Any vitals not documented were not able to be obtained and vitals that have been documented are patient reported.  VideoDeclined- This patient declined Librarian, academic. Therefore the visit was completed with audio only.  Persons Participating in Visit: Patient.  AWV Questionnaire: No: Patient Medicare AWV questionnaire was not completed prior to this visit.  Cardiac Risk Factors include: advanced age (>27men, >51 women);dyslipidemia     Objective:    Today's Vitals   01/26/24 0850  Weight: 105 lb (47.6 kg)  Height: 5\' 3"  (1.6 m)   Body mass index is 18.6 kg/m.     01/26/2024    9:04 AM 12/09/2022   10:18 AM 11/19/2021    1:10 PM 06/11/2021   10:31 AM 05/23/2021    6:21 AM 05/16/2021    9:58 AM 12/19/2020    8:48 AM  Advanced Directives  Does Patient Have a Medical Advance Directive? Yes No No Yes Yes Yes Yes  Type of Estate agent of [place];Living will   Healthcare Power of [place];Living will Healthcare Power of [place];Living will Healthcare Power of [place];Living will Healthcare Power of [place];Living will  Does patient want to make changes to medical advance directive?   No - Patient declined No - Patient declined No - Patient declined  No - Patient declined  Copy of Healthcare Power of Attorney in Chart? No - copy requested   No  - copy requested No - copy requested No - copy requested Yes - validated most recent copy scanned in chart (See row information)  Would patient like information on creating a medical advance directive?  No - Patient declined         Current Medications (verified) Outpatient Encounter Medications as of 01/26/2024  Medication Sig   Biotin 5 MG TABS Take 10 mg by mouth daily.   Ca Cit Malate-Cholecalciferol (CALCIUM CITRATE MALATE-VIT D PO) Take 3 tablets by mouth in the morning and at bedtime.   Cod Liver Oil CAPS Take 1 capsule by mouth daily.   ibuprofen (ADVIL) 800 MG tablet Take 1 tablet (800 mg total) by mouth every 8 (eight) hours as needed.   ipratropium (ATROVENT) 0.06 % nasal spray Place 2 sprays into both nostrils 4 (four) times daily.   Magnesium Oxide 420 (252 Mg) MG TABS Take 420 mg by mouth every evening.   Multiple Vitamins-Minerals (PX COMPLETE SENIOR MULTIVITS) TABS Take 1 tablet by mouth daily.   No facility-administered encounter medications on file as of 01/26/2024.    Allergies (verified) Neomycin-polymyxin-dexameth and Sulfa antibiotics   History: Past Medical History:  Diagnosis Date   Cataract    Hypercholesteremia 02/15/2022   Hypoglycemia    Osteoporosis 11/21/2017   Special screening for malignant neoplasms, colon    Tubal pregnancy    Past Surgical History:  Procedure Laterality Date   CATARACT EXTRACTION W/ INTRAOCULAR LENS IMPLANT Bilateral    COLONOSCOPY WITH PROPOFOL N/A 12/19/2020   Procedure: COLONOSCOPY  WITH PROPOFOL;  Surgeon: Pasty Spillers, MD;  Location: [facility];  Service: Endoscopy;  Laterality: N/A;  requests early priority 4   ECTOPIC PREGNANCY SURGERY     POLYPECTOMY N/A 12/19/2020   Procedure: POLYPECTOMY;  Surgeon: Pasty Spillers, MD;  Location: [facility];  Service: Endoscopy;  Laterality: N/A;   Family History  Problem Relation Age of Onset   Cancer Mother 58       lung   Cancer Father 76        brain   Memory loss Sister    Social History   Socioeconomic History   Marital status: Married    Spouse name: Not on file   Number of children: Not on file   Years of education: Not on file   Highest education level: Not on file  Occupational History   Not on file  Tobacco Use   Smoking status: Former    Current packs/day: 0.00    Types: Cigarettes    Quit date: 10/17/1972    Years since quitting: 51.3   Smokeless tobacco: Never  Vaping Use   Vaping status: Never Used  Substance and Sexual Activity   Alcohol use: No   Drug use: No   Sexual activity: Not on file  Other Topics Concern   Not on file  Social History Narrative   In [place]; with husband; worked as Journalist, 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    Social Drivers of Corporate investment banker Strain: Low Risk  (01/26/2024)   Overall Financial Resource Strain (CARDIA)    Difficulty of Paying Living Expenses: Not hard at all  Food Insecurity: No Food Insecurity (01/26/2024)   Hunger Vital Sign    Worried About Running Out of Food in the Last Year: Never true    Ran Out of Food in the Last Year: Never true  Transportation Needs: No Transportation Needs (01/26/2024)   PRAPARE - Administrator, Civil Service (Medical): No    Lack of Transportation (Non-Medical): No  Physical Activity: Sufficiently Active (01/26/2024)   Exercise Vital Sign    Days of Exercise per Week: 7 days    Minutes of Exercise per Session: 90 min  Stress: No Stress Concern Present (01/26/2024)   [employer] of Occupational Health - Occupational Stress Questionnaire    Feeling of Stress : Only a little  Social Connections: Socially Integrated (01/26/2024)   Social Connection and Isolation Panel [NHANES]    Frequency of Communication with Friends and Family: More than three times a week    Frequency of Social Gatherings with Friends and Family: More than three times a week    Attends Religious  Services: More than 4 times per year    Active Member of [employer] or Organizations: Yes    Attends Engineer, structural: More than 4 times per year    Marital Status: Married    Tobacco Counseling Counseling given: Not Answered    Clinical Intake:  Pre-visit preparation completed: Yes  Pain : No/denies pain     BMI - recorded: 18.6 Nutritional Status: BMI <19  Underweight Nutritional Risks: None Diabetes: No  Lab Results  Component Value Date   HGBA1C 5.6 05/24/2012     How often do you need to have someone help you when you read instructions, pamphlets, or other written materials from your doctor or pharmacy?: 1 - Never  Interpreter Needed?: No  Information entered by :: R. Louise Victory  LPN   Activities of Daily Living     01/26/2024    8:51 AM  In your present state of health, do you have any difficulty performing the following activities:  Hearing? 0  Vision? 0  Comment readers  Difficulty concentrating or making decisions? 0  Walking or climbing stairs? 0  Dressing or bathing? 0  Doing errands, shopping? 0  Preparing Food and eating ? N  Using the Toilet? N  In the past six months, have you accidently leaked urine? Y  Comment wears pads  Do you have problems with loss of bowel control? N  Managing your Medications? N  Managing your Finances? N  Housekeeping or managing your Housekeeping? N    Patient Care Team: Dale Esparto, MD as PCP - General (Internal Medicine) Pasty Spillers, MD (Inactive) (Gastroenterology)  Indicate any recent Medical Services you may have received from other than [facility] providers in the past year (date may be approximate).     Assessment:   This is a routine wellness examination for Talah.  Hearing/Vision screen Hearing Screening - Comments:: No issues Vision Screening - Comments:: readers   Goals Addressed             This Visit's Progress    Patient Stated       Wants to have an optimistic attitude and  continue to trust in God       Depression Screen     01/26/2024    8:57 AM 12/09/2022   10:23 AM 12/04/2022   10:33 AM 03/20/2022   11:17 AM 01/21/2022   10:10 AM 11/19/2021    1:13 PM 11/16/2020   11:45 AM  PHQ 2/9 Scores  PHQ - 2 Score 0 0 0 0 0 0 0  PHQ- 9 Score 0          Fall Risk     01/26/2024    8:53 AM 12/09/2022   10:10 AM 12/04/2022   10:33 AM 03/20/2022   11:17 AM 01/21/2022   10:09 AM  Fall Risk   Falls in the past year? 0 0 0 0 0  Number falls in past yr: 0 0 0  0  Injury with Fall? 0 0 0  0  Risk for fall due to : No Fall Risks  No Fall Risks No Fall Risks No Fall Risks  Follow up Falls prevention discussed;Falls evaluation completed Falls evaluation completed;Falls prevention discussed Falls evaluation completed Falls evaluation completed Falls evaluation completed    MEDICARE RISK AT HOME:  Medicare Risk at Home Any stairs in or around the home?: No If so, are there any without handrails?: No Home free of loose throw rugs in walkways, pet beds, electrical cords, etc?: Yes Adequate lighting in your home to reduce risk of falls?: Yes Life alert?: No Use of a cane, walker or w/c?: No Grab bars in the bathroom?: Yes Shower chair or bench in shower?: Yes Elevated toilet seat or a handicapped toilet?: Yes  TIMED UP AND GO:  Was the test performed?  No  Cognitive Function: 6CIT completed        01/26/2024    9:05 AM 12/09/2022   10:11 AM 11/16/2019   12:07 PM  6CIT Screen  What Year? 0 points 0 points 0 points  What month? 0 points 0 points 0 points  What time? 0 points 0 points 0 points  Count back from 20 0 points 0 points   Months in reverse 0 points 0 points  Repeat phrase 0 points 0 points   Total Score 0 points 0 points     Immunizations Immunization History  Administered Date(s) Administered   Influenza, High Dose Seasonal PF 08/25/2018, 10/04/2022   Influenza,inj,Quad PF,6+ Mos 07/12/2019   Influenza-Unspecified 08/07/2020, 09/13/2021    Moderna SARS-COV2 Booster Vaccination 09/13/2021   PFIZER(Purple Top)SARS-COV-2 Vaccination 12/23/2019, 01/19/2020, 08/11/2020   Pneumococcal Conjugate-13 01/18/2021   Pneumococcal Polysaccharide-23 07/12/2019   Td 05/21/2016   Zoster Recombinant(Shingrix) 02/07/2021, 04/20/2021    Screening Tests Health Maintenance  Topic Date Due   COVID-19 Vaccine (4 - 2024-25 season) 06/29/2023   INFLUENZA VACCINE  01/26/2024 (Originally 05/29/2023)   MAMMOGRAM  03/03/2024   Medicare Annual Wellness (AWV)  01/25/2025   DTaP/Tdap/Td (2 - Tdap) 05/21/2026   Colonoscopy  12/20/2027   Pneumonia Vaccine 57+ Years old  Completed   DEXA SCAN  Completed   Hepatitis C Screening  Completed   Zoster Vaccines- Shingrix  Completed   HPV VACCINES  Aged Out    Health Maintenance  Health Maintenance Due  Topic Date Due   COVID-19 Vaccine (4 - 2024-25 season) 06/29/2023   Health Maintenance Items Addressed: Patient declines covid vaccine. Patient wants to discuss at her next visit with PCP if she should have another Bone density now. .    Additional Screening:  Vision Screening: Recommended annual ophthalmology exams for early detection of glaucoma and other disorders of the eye. Up to date  [facility]  Dental Screening: Recommended annual dental exams for proper oral hygiene  Community Resource Referral / Chronic Care Management: CRR required this visit?  No   CCM required this visit?  No     Plan:     I have personally reviewed and noted the following in the patient's chart:   Medical and social history Use of alcohol, tobacco or illicit drugs  Current medications and supplements including opioid prescriptions. Patient is not currently taking opioid prescriptions. Functional ability and status Nutritional status Physical activity Advanced directives List of other physicians Hospitalizations, surgeries, and ER visits in previous 12 months Vitals Screenings to include cognitive,  depression, and falls Referrals and appointments  In addition, I have reviewed and discussed with patient certain preventive protocols, quality metrics, and best practice recommendations. A written personalized care plan for preventive services as well as general preventive health recommendations were provided to patient.     Sydell Axon, LPN   1/61/0960   After Visit Summary: (MyChart) Due to this being a telephonic visit, the after visit summary with patients personalized plan was offered to patient via MyChart   Notes: Nothing significant to report at this time.

## 2024-02-06 DIAGNOSIS — D0462 Carcinoma in situ of skin of left upper limb, including shoulder: Secondary | ICD-10-CM | POA: Diagnosis not present

## 2024-02-19 ENCOUNTER — Encounter: Payer: Medicare HMO | Admitting: Internal Medicine

## 2024-03-03 DIAGNOSIS — L989 Disorder of the skin and subcutaneous tissue, unspecified: Secondary | ICD-10-CM | POA: Diagnosis not present

## 2024-03-03 DIAGNOSIS — R208 Other disturbances of skin sensation: Secondary | ICD-10-CM | POA: Diagnosis not present

## 2024-03-03 DIAGNOSIS — R58 Hemorrhage, not elsewhere classified: Secondary | ICD-10-CM | POA: Diagnosis not present

## 2024-03-03 DIAGNOSIS — D2372 Other benign neoplasm of skin of left lower limb, including hip: Secondary | ICD-10-CM | POA: Diagnosis not present

## 2024-03-03 DIAGNOSIS — D485 Neoplasm of uncertain behavior of skin: Secondary | ICD-10-CM | POA: Diagnosis not present

## 2024-03-10 ENCOUNTER — Ambulatory Visit
Admission: EM | Admit: 2024-03-10 | Discharge: 2024-03-10 | Disposition: A | Discharge status: Home / Self Care | Attending: Family Medicine | Admitting: Family Medicine

## 2024-03-10 DIAGNOSIS — L03116 Cellulitis of left lower limb: Secondary | ICD-10-CM | POA: Diagnosis not present

## 2024-03-10 MED ORDER — DOXYCYCLINE HYCLATE 100 MG PO CAPS: 100.0000 mg | ORAL_CAPSULE | Freq: Two times a day (BID) | ORAL | 0 refills | Status: DC

## 2024-03-10 NOTE — ED Triage Notes (Signed)
 Pt c/o sore in L leg x2 mon. States after awhile it scabbed up & she scratched it which caused it to burst 3 wks ago. Saw her derm last wk & was dx w/blood vein growth & had it shaved/cauterized.

## 2024-03-10 NOTE — ED Provider Notes (Signed)
 [facility]    CSN: [id number] Arrival date & time: 03/10/24  0815      History   Chief Complaint Chief Complaint  Patient presents with   Leg Problem    HPI Mary Everett is a 78 y.o. female.   HPI  Mary Everett presents for left lower leg redness.    About 2 months ago, she had a sore on her leg that wouldn't heal. She she picked at the scab in the shower and it started shooting a "fine stream of blood."  Her husband wrapped it and it stopped bleeding. She called the dermatologist and was seen a week ago. The dermatologist removed it and called it a "blood vein growth."  She cut the area off and it bleed a lot but that stopped after cauterizing it.  Now, there is more redness and soreness in her left lower leg that she noticed yesterday.  Slight bloody drainage.  No fever.    Past Medical History:  Diagnosis Date   Cataract    Hypercholesteremia 02/15/2022   Hypoglycemia    Osteoporosis 11/21/2017   Special screening for malignant neoplasms, colon    Tubal pregnancy     Patient Active Problem List   Diagnosis Date Noted   Constipation 01/25/2023   Cellulitis 10/07/2022   Chest pain 07/17/2022   Encounter for completion of form with patient 03/26/2022   Hypercholesteremia 02/15/2022   Crackling sound in ear 01/27/2022   Special screening for malignant neoplasms, colon    Polyp of colon    Wrist fracture 07/09/2020   Acquired neutropenia (HCC) 01/13/2020   Healthcare maintenance 01/08/2020   Leukopenia 01/08/2020   Thrombocytopenia (HCC) 01/08/2020   Eyelid dermatitis, infectious 01/05/2020   Abscess of buttock 08/15/2019   Colon cancer screening 08/15/2019   Breast cancer screening 08/15/2019   Acute non-recurrent maxillary sinusitis 10/26/2018   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   Insect bite 05/19/2018   Osteoporosis 11/21/2017   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/21/2017    Past Surgical History:  Procedure Laterality Date   CATARACT EXTRACTION W/  INTRAOCULAR LENS IMPLANT Bilateral    COLONOSCOPY WITH PROPOFOL  N/A 12/19/2020   Procedure: COLONOSCOPY WITH PROPOFOL ;  Surgeon: Irby Mannan, MD;  Location: [facility];  Service: Endoscopy;  Laterality: N/A;  requests early priority 4   ECTOPIC PREGNANCY SURGERY     POLYPECTOMY N/A 12/19/2020   Procedure: POLYPECTOMY;  Surgeon: Irby Mannan, MD;  Location: [facility];  Service: Endoscopy;  Laterality: N/A;    OB History   No obstetric history on file.      Home Medications    Prior to Admission medications   Medication Sig Start Date End Date Taking? Authorizing Provider  Biotin 5 MG TABS Take 10 mg by mouth daily.   Yes [provider]  Ca Cit Malate-Cholecalciferol (CALCIUM CITRATE MALATE-VIT D PO) Take 3 tablets by mouth in the morning and at bedtime.   Yes [provider]  Cod Liver Oil CAPS Take 1 capsule by mouth daily.   Yes [provider]  doxycycline  (VIBRAMYCIN ) 100 MG capsule Take 1 capsule (100 mg total) by mouth 2 (two) times daily. 03/10/24  Yes Nael Petrosyan, DO  ipratropium (ATROVENT ) 0.06 % nasal spray Place 2 sprays into both nostrils 4 (four) times daily. 01/09/23  Yes Maydell Knoebel, DO  Magnesium Oxide 420 (252 Mg) MG TABS Take 420 mg by mouth every evening.   Yes [provider]  Multiple Vitamins-Minerals [facility]  COMPLETE SENIOR MULTIVITS) TABS Take 1 tablet by mouth daily.   Yes [provider]  ibuprofen  (ADVIL ) 800 MG tablet Take 1 tablet (800 mg total) by mouth every 8 (eight) hours as needed. 05/23/21   Mercy Stall, MD    Family History Family History  Problem Relation Age of Onset   Cancer Mother 39       lung   Cancer Father 67       brain   Memory loss Sister     Social History Social History   Tobacco Use   Smoking status: Former    Current packs/day: 0.00    Types: Cigarettes    Quit date: 10/17/1972    Years since quitting: 51.4   Smokeless tobacco: Never   Vaping Use   Vaping status: Never Used  Substance Use Topics   Alcohol use: No   Drug use: No     Allergies   Neomycin-polymyxin-dexameth and Sulfa antibiotics   Review of Systems Review of Systems :negative unless otherwise stated in HPI.      Physical Exam Triage Vital Signs ED Triage Vitals [03/10/24 0824]  Encounter Vitals Group     BP      Systolic BP Percentile      Diastolic BP Percentile      Pulse      Resp 16     Temp      Temp Source Oral     SpO2      Weight      Height      Head Circumference      Peak Flow      Pain Score      Pain Loc      Pain Education      Exclude from Growth Chart    No data found.  Updated Vital Signs BP 111/67 (BP Location: Left Arm)   Pulse 61   Temp 98.1 F (36.7 C) (Oral)   Resp 16   Ht 5\' 3"  (1.6 m)   Wt 49.9 kg   SpO2 95%   BMI 19.49 kg/m   Visual Acuity Right Eye Distance:   Left Eye Distance:   Bilateral Distance:    Right Eye Near:   Left Eye Near:    Bilateral Near:     Physical Exam  GEN: alert, well appearing female, in no acute distress  EYES: no scleral injection or discharge CV: regular rate, brisk cap refill  RESP: no increased work of breathing MSK: mild localized left lower extremity edema NEURO: alert, moves all extremities appropriately SKIN: 2.5 cm x 2 cm erythematous patch with central eschar with warmth, no fluctuance or drainage        UC Treatments / Results  Labs (all labs ordered are listed, but only abnormal results are displayed) Labs Reviewed - No data to display  EKG   Radiology No results found.  Procedures Procedures (including critical care time)  Medications Ordered in UC Medications - No data to display  Initial Impression / Assessment and Plan / UC Course  I have reviewed the triage vital signs and the nursing notes.  Pertinent labs & imaging results that were available during my care of the patient were reviewed by me and considered in my medical  decision making (see chart for details).     Patient is a 78 y.o. femalewho presents for lower leg lesion.  Overall, patient is well-appearing and well-hydrated.  Vital signs stable.  Yaritzel is afebrile.  Exam concerning  for cellulitis after her dermatologic procedure that was performed by an outside provider. Suspect cellulitis. Treat with doxycycline  as below. Pt to follow up with her dermatologist for further evaluation if not improving.    Reviewed expectations regarding course of current medical issues.  All questions asked were answered.  Outlined signs and symptoms indicating need for more acute intervention. Patient verbalized understanding. After Visit Summary given.   Final Clinical Impressions(s) / UC Diagnoses   Final diagnoses:  Cellulitis of left lower extremity     Discharge Instructions      Stop by the pharmacy to pick up your prescriptions.  Follow up with your dermatologist, if not improving.     ED Prescriptions     Medication Sig Dispense Auth. Provider   doxycycline  (VIBRAMYCIN ) 100 MG capsule Take 1 capsule (100 mg total) by mouth 2 (two) times daily. 14 capsule Raul Winterhalter, DO      PDMP not reviewed this encounter.              Cherie Lasalle, DO 03/10/24 8119

## 2024-03-10 NOTE — Discharge Instructions (Signed)
 Stop by the pharmacy to pick up your prescriptions.  Follow up with your dermatologist, if not improving.

## 2024-03-15 ENCOUNTER — Ambulatory Visit: Admitting: Internal Medicine

## 2024-03-15 DIAGNOSIS — S81802A Unspecified open wound, left lower leg, initial encounter: Secondary | ICD-10-CM | POA: Diagnosis not present

## 2024-03-23 ENCOUNTER — Ambulatory Visit: Admitting: Internal Medicine

## 2024-03-24 DIAGNOSIS — K080 Exfoliation of teeth due to systemic causes: Secondary | ICD-10-CM | POA: Diagnosis not present

## 2024-05-11 ENCOUNTER — Ambulatory Visit: Admitting: Internal Medicine

## 2024-05-17 ENCOUNTER — Encounter: Payer: Self-pay | Admitting: Internal Medicine

## 2024-05-17 DIAGNOSIS — Z1231 Encounter for screening mammogram for malignant neoplasm of breast: Secondary | ICD-10-CM | POA: Diagnosis not present

## 2024-05-17 LAB — HM MAMMOGRAPHY

## 2024-06-10 DIAGNOSIS — H04123 Dry eye syndrome of bilateral lacrimal glands: Secondary | ICD-10-CM | POA: Diagnosis not present

## 2024-06-10 DIAGNOSIS — H10502 Unspecified blepharoconjunctivitis, left eye: Secondary | ICD-10-CM | POA: Diagnosis not present

## 2024-06-10 DIAGNOSIS — H0289 Other specified disorders of eyelid: Secondary | ICD-10-CM | POA: Diagnosis not present

## 2024-06-10 DIAGNOSIS — Z961 Presence of intraocular lens: Secondary | ICD-10-CM | POA: Diagnosis not present

## 2024-06-11 ENCOUNTER — Encounter: Admitting: Internal Medicine

## 2024-07-05 DIAGNOSIS — M1990 Unspecified osteoarthritis, unspecified site: Secondary | ICD-10-CM | POA: Diagnosis not present

## 2024-07-07 ENCOUNTER — Encounter: Admitting: Internal Medicine

## 2024-07-14 ENCOUNTER — Telehealth: Payer: Self-pay

## 2024-07-14 NOTE — Telephone Encounter (Signed)
 Copied from CRM [id number]. Topic: Clinical - Medical Advice >> Jul 14, 2024  2:16 PM Anairis L wrote: Reason for CRM: Patient is calling in today because she has had a sore/pimple for the last 6 weeks at the tip of her nose. She still has soreness. She reached out to her derm but since it is inside they advised her to see her pcp. No app available until Nov. She would like a call back with Advise from Dr. Glendia

## 2024-07-15 NOTE — Telephone Encounter (Signed)
 Pt scheduled with Dr Onesimo on Monday.

## 2024-07-19 ENCOUNTER — Ambulatory Visit (INDEPENDENT_AMBULATORY_CARE_PROVIDER_SITE_OTHER): Admitting: Internal Medicine

## 2024-07-19 ENCOUNTER — Encounter: Payer: Self-pay | Admitting: Internal Medicine

## 2024-07-19 VITALS — BP 110/68 | HR 78 | Temp 97.8°F | Ht 63.0 in | Wt 104.8 lb

## 2024-07-19 DIAGNOSIS — L089 Local infection of the skin and subcutaneous tissue, unspecified: Secondary | ICD-10-CM | POA: Insufficient documentation

## 2024-07-19 NOTE — Progress Notes (Signed)
 Acute Office Visit  Subjective:     Patient ID: Mary Everett, female    DOB: 10-07-1946, 78 y.o.   MRN: [id number]  Chief Complaint  Patient presents with   Acute Visit    Sore in right side of  nose x 1 month Soreness went away then a pimple popped up on outside of right side of nose    Discussed the use of AI scribe software for clinical note transcription with the patient, who gave verbal consent to proceed.  History of Present Illness Mary Everett is a 78 year old female who presents with a persistent sore inside her nose.  Nasal mucosal ulceration - Persistent sore located inside the nose, specifically on the lip area - Typical pattern is for the sore to become painful and then resolve, but this episode has persisted for 1.5 months - First month characterized by significant soreness, with moderate soreness over the past two weeks.  Patient states that soreness is resolved now and she developed a pimple at the site.  She did have pus from the pimple and still has a persistent bump over the right nare - No trauma to the nose but states that she may have put her finger in her nose if something was there - No use of intranasal substances except for brief use of polysporin, which was discontinued -Did not use Vaseline or any other substances  Nasal cutaneous lesion - Pimple on the external aspect of the nose - Pimple has discharged pus on a couple of occasions - No concern regarding the pimple     Review of Systems  Constitutional: Negative.   HENT:  Negative for congestion and nosebleeds.        Complains of soreness inside her right nare for the last month which is now resolved  Respiratory: Negative.    Cardiovascular: Negative.   Gastrointestinal: Negative.   Genitourinary: Negative.   Musculoskeletal: Negative.   Skin:        Patient with a pimple over her right nare with occasional pus from it over the last day or 2  Neurological: Negative.    Psychiatric/Behavioral: Negative.          Objective:    BP 110/68   Pulse 78   Temp 97.8 F (36.6 C)   Ht 5' 3 (1.6 m)   Wt 104 lb 12.8 oz (47.5 kg)   SpO2 94%   BMI 18.56 kg/m    Physical Exam Constitutional:      Appearance: Normal appearance.  HENT:     Head: Normocephalic and atraumatic.     Nose: No congestion or rhinorrhea.     Comments: No visible sore or erythema or swelling inside either nare Cardiovascular:     Rate and Rhythm: Normal rate and regular rhythm.     Heart sounds: Normal heart sounds.  Pulmonary:     Effort: Pulmonary effort is normal.     Breath sounds: Normal breath sounds. No wheezing, rhonchi or rales.  Abdominal:     General: Bowel sounds are normal. There is no distension.     Palpations: Abdomen is soft.     Tenderness: There is no abdominal tenderness. There is no guarding or rebound.  Musculoskeletal:        General: No swelling or tenderness.     Right lower leg: No edema.     Left lower leg: No edema.  Skin:    Comments: Pimple noted over the right nostril  Neurological:  Mental Status: She is alert.  Psychiatric:        Mood and Affect: Mood normal.        Behavior: Behavior normal.     No results found for any visits on 07/19/24.      Assessment & Plan:   Problem List Items Addressed This Visit       Genitourinary   Pustule of nostril - Primary   - Patient complaining of soreness in the inside of her right nostril over the last month -She denies any trauma but does state that she may have put her finger in her ear at some point -She initially tried some Polysporin but stopped after she read that this may cause further irritation -Patient is a soreness lasted approximately 1 month and has now resolved -She does state that over the last couple days she noted a pimple her right nostril externally and did have some purulent discharge from this -On exam, no erythema or lesion noted internally in either nare.  She  does have a pustule over her right nostril -Patient was advised to use warm compresses for this -I suspect this should resolve over the next day or 2 -If your symptoms persist would consider follow-up with a dermatologist       No orders of the defined types were placed in this encounter.   No follow-ups on file.  Imraan Wendell, MD

## 2024-07-19 NOTE — Assessment & Plan Note (Signed)
-   Patient complaining of soreness in the inside of her right nostril over the last month -She denies any trauma but does state that she may have put her finger in her ear at some point -She initially tried some Polysporin but stopped after she read that this may cause further irritation -Patient is a soreness lasted approximately 1 month and has now resolved -She does state that over the last couple days she noted a pimple her right nostril externally and did have some purulent discharge from this -On exam, no erythema or lesion noted internally in either nare.  She does have a pustule over her right nostril -Patient was advised to use warm compresses for this -I suspect this should resolve over the next day or 2 -If your symptoms persist would consider follow-up with a dermatologist

## 2024-07-19 NOTE — Patient Instructions (Signed)
  VISIT SUMMARY: During your visit, we discussed the persistent sore inside your nose and a pimple on the outside of your nose. We also reviewed your history of recurrent infections. After examining the sore and the pimple, I provided recommendations to help with healing and discussed when to seek further medical attention.  YOUR PLAN: -NASAL MUCOSAL TRAUMA WITH SECONDARY INFECTION: The sore inside your nose is likely due to irritation from touching it, which has caused a secondary infection. Avoid using polysporin inside your nose as it may worsen the irritation. Instead, apply warm compresses to the sore to help it heal. If the sore does not improve, gets bigger, or changes in appearance, you should see a dermatologist.  INSTRUCTIONS: If the sore inside your nose does not improve, gets bigger, or changes in appearance, please schedule an appointment with a dermatologist for further evaluation.                      Contains text generated by Abridge.                                 Contains text generated by Abridge.

## 2024-08-03 DIAGNOSIS — H04123 Dry eye syndrome of bilateral lacrimal glands: Secondary | ICD-10-CM | POA: Diagnosis not present

## 2024-08-03 DIAGNOSIS — Z961 Presence of intraocular lens: Secondary | ICD-10-CM | POA: Diagnosis not present

## 2024-08-03 DIAGNOSIS — H43812 Vitreous degeneration, left eye: Secondary | ICD-10-CM | POA: Diagnosis not present

## 2024-09-06 ENCOUNTER — Ambulatory Visit: Admitting: Internal Medicine

## 2024-09-06 ENCOUNTER — Encounter: Payer: Self-pay | Admitting: Internal Medicine

## 2024-09-06 VITALS — BP 112/70 | HR 69 | Temp 97.9°F | Ht 63.0 in | Wt 101.8 lb

## 2024-09-06 DIAGNOSIS — Z13220 Encounter for screening for lipoid disorders: Secondary | ICD-10-CM | POA: Diagnosis not present

## 2024-09-06 DIAGNOSIS — Z1211 Encounter for screening for malignant neoplasm of colon: Secondary | ICD-10-CM

## 2024-09-06 DIAGNOSIS — Z0000 Encounter for general adult medical examination without abnormal findings: Secondary | ICD-10-CM

## 2024-09-06 DIAGNOSIS — D696 Thrombocytopenia, unspecified: Secondary | ICD-10-CM

## 2024-09-06 DIAGNOSIS — I83899 Varicose veins of unspecified lower extremities with other complications: Secondary | ICD-10-CM

## 2024-09-06 DIAGNOSIS — E7800 Pure hypercholesterolemia, unspecified: Secondary | ICD-10-CM

## 2024-09-06 DIAGNOSIS — D72819 Decreased white blood cell count, unspecified: Secondary | ICD-10-CM

## 2024-09-06 DIAGNOSIS — I8393 Asymptomatic varicose veins of bilateral lower extremities: Secondary | ICD-10-CM | POA: Diagnosis not present

## 2024-09-06 DIAGNOSIS — I8390 Asymptomatic varicose veins of unspecified lower extremity: Secondary | ICD-10-CM | POA: Insufficient documentation

## 2024-09-06 NOTE — Assessment & Plan Note (Signed)
 Physical today 09/06/24.  PAP 12/2016 negative with negative HPV.  Colonoscopy 11/2020 - tubular adenoma x 2.  Recommended f/u colonoscopy in 7 years.  Mammogram 05/17/24 - [facility] I [facility])

## 2024-09-06 NOTE — Progress Notes (Signed)
 Subjective:    Patient ID: Mary Everett, female    DOB: 06/17/1946, 78 y.o.   MRN: [id number]  Patient here for  Chief Complaint  Patient presents with   Annual Exam    HPI Here for a physical exam.  She reports she is doing relatively well. Tries to stay active. No chest pain or sob reported. No abdominal pain or bowel change reported. Does report 01/2024 - bleeding/scab - lower leg. One month later - noticed - red blood. Saw dermatology - s/p excision. Took weeks to close - compression hose. Had redness and swelling. Three weeks ago - pin scratched. Persistent problem - varicose veins. Discussed vascular referral.   Bone density? Past Medical History:  Diagnosis Date   Cataract    Hypercholesteremia 02/15/2022   Hypoglycemia    Osteoporosis 11/21/2017   Special screening for malignant neoplasms, colon    Tubal pregnancy    Past Surgical History:  Procedure Laterality Date   CATARACT EXTRACTION W/ INTRAOCULAR LENS IMPLANT Bilateral    COLONOSCOPY WITH PROPOFOL  N/A 12/19/2020   Procedure: COLONOSCOPY WITH PROPOFOL ;  Surgeon: Janalyn Keene NOVAK, MD;  Location: [facility];  Service: Endoscopy;  Laterality: N/A;  requests early priority 4   ECTOPIC PREGNANCY SURGERY     POLYPECTOMY N/A 12/19/2020   Procedure: POLYPECTOMY;  Surgeon: Janalyn Keene NOVAK, MD;  Location: [facility];  Service: Endoscopy;  Laterality: N/A;   Family History  Problem Relation Age of Onset   Cancer Mother 88       lung   Cancer Father 71       brain   Memory loss Sister    Social History   Socioeconomic History   Marital status: Married    Spouse name: Not on file   Number of children: Not on file   Years of education: Not on file   Highest education level: Bachelor's degree (e.g., BA, AB, BS)  Occupational History   Not on file  Tobacco Use   Smoking status: Former    Current packs/day: 0.00    Types: Cigarettes    Quit date: 10/17/1972    Years since quitting: 51.9    Smokeless tobacco: Never  Vaping Use   Vaping status: Never Used  Substance and Sexual Activity   Alcohol use: No   Drug use: No   Sexual activity: Not on file  Other Topics Concern   Not on file  Social History Narrative   In [place]; with husband; worked as journalist, 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    Social Drivers of Corporate Investment Banker Strain: Low Risk  (07/16/2024)   Overall Financial Resource Strain (CARDIA)    Difficulty of Paying Living Expenses: Not hard at all  Food Insecurity: Unknown (07/16/2024)   Hunger Vital Sign    Worried About Running Out of Food in the Last Year: Never true    Ran Out of Food in the Last Year: Not on file  Transportation Needs: No Transportation Needs (07/16/2024)   PRAPARE - Administrator, Civil Service (Medical): No    Lack of Transportation (Non-Medical): No  Physical Activity: Sufficiently Active (07/16/2024)   Exercise Vital Sign    Days of Exercise per Week: 7 days    Minutes of Exercise per Session: 50 min  Stress: No Stress Concern Present (07/16/2024)   [employer] of Occupational Health - Occupational Stress Questionnaire    Feeling of Stress: Not at all  Social Connections: Socially Integrated (07/16/2024)   Social Connection and Isolation Panel    Frequency of Communication with Friends and Family: Three times a week    Frequency of Social Gatherings with Friends and Family: Three times a week    Attends Religious Services: 1 to 4 times per year    Active Member of Clubs or Organizations: Yes    Attends Banker Meetings: 1 to 4 times per year    Marital Status: Married     Review of Systems  Constitutional:  Negative for appetite change and unexpected weight change.  HENT:  Negative for congestion, sinus pressure and sore throat.   Eyes:  Negative for pain and visual disturbance.  Respiratory:  Negative for cough, chest tightness and shortness of  breath.   Cardiovascular:  Negative for chest pain, palpitations and leg swelling.  Gastrointestinal:  Negative for abdominal pain, diarrhea, nausea and vomiting.  Genitourinary:  Negative for difficulty urinating and dysuria.  Musculoskeletal:  Negative for joint swelling and myalgias.  Skin:        Varicose veins/spider veins.   Neurological:  Negative for dizziness and headaches.  Hematological:  Negative for adenopathy. Does not bruise/bleed easily.  Psychiatric/Behavioral:  Negative for agitation and dysphoric mood.        Objective:     BP 112/70   Pulse 69   Temp 97.9 F (36.6 C) (Oral)   Ht 5' 3 (1.6 m)   Wt 101 lb 12.8 oz (46.2 kg)   SpO2 98%   BMI 18.03 kg/m  Wt Readings from Last 3 Encounters:  09/06/24 101 lb 12.8 oz (46.2 kg)  07/19/24 104 lb 12.8 oz (47.5 kg)  03/10/24 110 lb (49.9 kg)    Physical Exam Vitals reviewed.  Constitutional:      General: She is not in acute distress.    Appearance: Normal appearance. She is well-developed.  HENT:     Head: Normocephalic and atraumatic.     Right Ear: External ear normal.     Left Ear: External ear normal.     Mouth/Throat:     Pharynx: No oropharyngeal exudate or posterior oropharyngeal erythema.  Eyes:     General: No scleral icterus.       Right eye: No discharge.        Left eye: No discharge.     Conjunctiva/sclera: Conjunctivae normal.  Neck:     Thyroid : No thyromegaly.  Cardiovascular:     Rate and Rhythm: Normal rate and regular rhythm.  Pulmonary:     Effort: No tachypnea, accessory muscle usage or respiratory distress.     Breath sounds: Normal breath sounds. No decreased breath sounds or wheezing.  Chest:  Breasts:    Right: No inverted nipple, mass, nipple discharge or tenderness (no axillary adenopathy).     Left: No inverted nipple, mass, nipple discharge or tenderness (no axilarry adenopathy).  Abdominal:     General: Bowel sounds are normal.     Palpations: Abdomen is soft.      Tenderness: There is no abdominal tenderness.  Musculoskeletal:        General: No swelling or tenderness.     Cervical back: Neck supple.  Lymphadenopathy:     Cervical: No cervical adenopathy.  Skin:    Findings: No rash.     Comments: Lower extremities - varicose veins.   Neurological:     Mental Status: She is alert and oriented to person, place, and time.  Psychiatric:  Mood and Affect: Mood normal.        Behavior: Behavior normal.         Outpatient Encounter Medications as of 09/06/2024  Medication Sig   Biotin 5 MG TABS Take 10 mg by mouth daily.   Ca Cit Malate-Cholecalciferol (CALCIUM CITRATE MALATE-VIT D PO) Take 3 tablets by mouth in the morning and at bedtime.   Cod Liver Oil CAPS Take 1 capsule by mouth daily.   Magnesium Oxide 420 (252 Mg) MG TABS Take 420 mg by mouth every evening.   Multiple Vitamins-Minerals (PX COMPLETE SENIOR MULTIVITS) TABS Take 1 tablet by mouth daily.   [DISCONTINUED] ibuprofen  (ADVIL ) 800 MG tablet Take 1 tablet (800 mg total) by mouth every 8 (eight) hours as needed.   [DISCONTINUED] ofloxacin (OCUFLOX) 0.3 % ophthalmic solution Place 1 drop into the left eye 4 (four) times daily.   No facility-administered encounter medications on file as of 09/06/2024.     Lab Results  Component Value Date   WBC 6.4 09/06/2024   HGB 12.6 09/06/2024   HCT 36.9 09/06/2024   PLT 165.0 09/06/2024   GLUCOSE 89 09/06/2024   CHOL 241 (H) 09/06/2024   TRIG 59.0 09/06/2024   HDL 89.60 09/06/2024   LDLCALC 139 (H) 09/06/2024   ALT 22 09/06/2024   AST 28 09/06/2024   NA 139 09/06/2024   K 4.3 09/06/2024   CL 103 09/06/2024   CREATININE 0.54 09/06/2024   BUN 30 (H) 09/06/2024   CO2 28 09/06/2024   TSH 1.57 09/06/2024   HGBA1C 5.6 05/24/2012       Assessment & Plan:  Healthcare maintenance Assessment & Plan: Physical today 09/06/24.  PAP 12/2016 negative with negative HPV.  Colonoscopy 11/2020 - tubular adenoma x 2.  Recommended f/u  colonoscopy in 7 years.  Mammogram 05/17/24 - Birads I ([facility])   Thrombocytopenia Assessment & Plan: Check cbc today.   Orders: -     Basic metabolic panel with GFR -     CBC with Differential/Platelet -     TSH  Screening cholesterol level -     Hepatic function panel -     Lipid panel  Varicose veins of both lower extremities, unspecified whether complicated Assessment & Plan: Issues with recent bleeding, swelling and redness. Varicosities. Refer to [facility] for further evaluation and treatment.   Orders: -     Ambulatory referral to Vascular Surgery  Bleeding from varicose vein -     Ambulatory referral to Vascular Surgery  Colon cancer screening Assessment & Plan: Colonoscopy 12/19/20 -Two 5 to 6 mm polyps in the descending colon and in the ascending colon, removed with a cold snare. Resected and retrieved. The examination was otherwise normal. The rectum, sigmoid colon, descending colon, transverse colon, ascending colon and cecum are normal. The distal rectum and anal verge are normal on retroflexion view.    Hypercholesteremia Assessment & Plan: Follow lipid panel.    Leukopenia, unspecified type Assessment & Plan: Check cbc today.       Allena Hamilton, MD

## 2024-09-07 LAB — CBC WITH DIFFERENTIAL/PLATELET
Basophils Absolute: 0.1 K/uL (ref 0.0–0.1)
Basophils Relative: 2.1 % (ref 0.0–3.0)
Eosinophils Absolute: 0 K/uL (ref 0.0–0.7)
Eosinophils Relative: 0.6 % (ref 0.0–5.0)
HCT: 36.9 % (ref 36.0–46.0)
Hemoglobin: 12.6 g/dL (ref 12.0–15.0)
Lymphocytes Relative: 36.8 % (ref 12.0–46.0)
Lymphs Abs: 2.4 K/uL (ref 0.7–4.0)
MCHC: 34.2 g/dL (ref 30.0–36.0)
MCV: 83.4 fl (ref 78.0–100.0)
Monocytes Absolute: 1.8 K/uL — ABNORMAL HIGH (ref 0.1–1.0)
Monocytes Relative: 28.7 % — ABNORMAL HIGH (ref 3.0–12.0)
Neutro Abs: 2.1 K/uL (ref 1.4–7.7)
Neutrophils Relative %: 31.8 % — ABNORMAL LOW (ref 43.0–77.0)
Platelets: 165 K/uL (ref 150.0–400.0)
RBC: 4.43 Mil/uL (ref 3.87–5.11)
RDW: 15.3 % (ref 11.5–15.5)
WBC: 6.4 K/uL (ref 4.0–10.5)

## 2024-09-07 LAB — BASIC METABOLIC PANEL WITH GFR
BUN: 30 mg/dL — ABNORMAL HIGH (ref 6–23)
CO2: 28 meq/L (ref 19–32)
Calcium: 9.1 mg/dL (ref 8.4–10.5)
Chloride: 103 meq/L (ref 96–112)
Creatinine, Ser: 0.54 mg/dL (ref 0.40–1.20)
GFR: 89.26 mL/min (ref >60.00)
Glucose, Bld: 89 mg/dL (ref 70–99)
Potassium: 4.3 meq/L (ref 3.5–5.1)
Sodium: 139 meq/L (ref 135–145)

## 2024-09-07 LAB — HEPATIC FUNCTION PANEL
ALT: 22 U/L (ref 0–35)
AST: 28 U/L (ref 0–37)
Albumin: 4.3 g/dL (ref 3.5–5.2)
Alkaline Phosphatase: 66 U/L (ref 39–117)
Bilirubin, Direct: 0.1 mg/dL (ref 0.0–0.3)
Total Bilirubin: 0.4 mg/dL (ref 0.2–1.2)
Total Protein: 6.8 g/dL (ref 6.0–8.3)

## 2024-09-07 LAB — LIPID PANEL
Cholesterol: 241 mg/dL — ABNORMAL HIGH (ref 0–200)
HDL: 89.6 mg/dL (ref >39.00)
LDL Cholesterol: 139 mg/dL — ABNORMAL HIGH (ref 0–99)
NonHDL: 151.25
Total CHOL/HDL Ratio: 3
Triglycerides: 59 mg/dL (ref 0.0–149.0)
VLDL: 11.8 mg/dL (ref 0.0–40.0)

## 2024-09-07 LAB — TSH: TSH: 1.57 u[IU]/mL (ref 0.35–5.50)

## 2024-09-08 ENCOUNTER — Ambulatory Visit: Payer: Self-pay | Admitting: Internal Medicine

## 2024-09-08 DIAGNOSIS — D696 Thrombocytopenia, unspecified: Secondary | ICD-10-CM

## 2024-09-12 ENCOUNTER — Encounter: Payer: Self-pay | Admitting: Internal Medicine

## 2024-09-12 NOTE — Assessment & Plan Note (Signed)
Check cbc today 

## 2024-09-12 NOTE — Assessment & Plan Note (Signed)
 Follow lipid panel.

## 2024-09-12 NOTE — Assessment & Plan Note (Signed)
 Issues with recent bleeding, swelling and redness. Varicosities. Refer to [facility] for further evaluation and treatment.

## 2024-09-12 NOTE — Assessment & Plan Note (Signed)
Colonoscopy 12/19/20 -Two 5 to 6 mm polyps in the descending colon and in the ascending colon, removed with a cold snare. Resected and retrieved. The examination was otherwise normal. The rectum, sigmoid colon, descending colon, transverse colon, ascending colon and cecum are normal. The distal rectum and anal verge are normal on retroflexion view.

## 2024-10-25 ENCOUNTER — Other Ambulatory Visit

## 2024-10-25 ENCOUNTER — Other Ambulatory Visit (INDEPENDENT_AMBULATORY_CARE_PROVIDER_SITE_OTHER)

## 2024-10-25 DIAGNOSIS — D696 Thrombocytopenia, unspecified: Secondary | ICD-10-CM

## 2024-10-26 LAB — CBC WITH DIFFERENTIAL/PLATELET
Basophils Absolute: 0.1 K/uL (ref 0.0–0.1)
Basophils Relative: 2 % (ref 0.0–3.0)
Eosinophils Absolute: 0.1 K/uL (ref 0.0–0.7)
Eosinophils Relative: 0.8 % (ref 0.0–5.0)
HCT: 37.7 % (ref 36.0–46.0)
Hemoglobin: 12.7 g/dL (ref 12.0–15.0)
Lymphocytes Relative: 33.8 % (ref 12.0–46.0)
Lymphs Abs: 2.3 K/uL (ref 0.7–4.0)
MCHC: 33.6 g/dL (ref 30.0–36.0)
MCV: 84.6 fl (ref 78.0–100.0)
Monocytes Absolute: 2.4 K/uL — ABNORMAL HIGH (ref 0.1–1.0)
Monocytes Relative: 35.1 % — ABNORMAL HIGH (ref 3.0–12.0)
Neutro Abs: 1.9 K/uL (ref 1.4–7.7)
Neutrophils Relative %: 28.3 % — ABNORMAL LOW (ref 43.0–77.0)
Platelets: 159 K/uL (ref 150.0–400.0)
RBC: 4.46 Mil/uL (ref 3.87–5.11)
RDW: 15.4 % (ref 11.5–15.5)
WBC: 6.8 K/uL (ref 4.0–10.5)

## 2024-10-27 ENCOUNTER — Other Ambulatory Visit (INDEPENDENT_AMBULATORY_CARE_PROVIDER_SITE_OTHER): Payer: Self-pay | Admitting: Nurse Practitioner

## 2024-10-27 DIAGNOSIS — I83893 Varicose veins of bilateral lower extremities with other complications: Secondary | ICD-10-CM

## 2024-11-08 ENCOUNTER — Ambulatory Visit (INDEPENDENT_AMBULATORY_CARE_PROVIDER_SITE_OTHER): Payer: Self-pay | Admitting: Nurse Practitioner

## 2024-11-08 ENCOUNTER — Encounter (INDEPENDENT_AMBULATORY_CARE_PROVIDER_SITE_OTHER): Payer: Self-pay | Admitting: Nurse Practitioner

## 2024-11-08 ENCOUNTER — Ambulatory Visit (INDEPENDENT_AMBULATORY_CARE_PROVIDER_SITE_OTHER): Payer: Self-pay

## 2024-11-08 VITALS — BP 109/69 | HR 59 | Resp 16 | Ht 63.0 in | Wt 102.4 lb

## 2024-11-08 DIAGNOSIS — I8393 Asymptomatic varicose veins of bilateral lower extremities: Secondary | ICD-10-CM | POA: Diagnosis not present

## 2024-11-08 DIAGNOSIS — I83893 Varicose veins of bilateral lower extremities with other complications: Secondary | ICD-10-CM

## 2024-11-08 NOTE — Progress Notes (Signed)
 "  Subjective:    Patient ID: Mary Everett, female    DOB: [birth year], 79 y.o.   MRN: [id number] Chief Complaint  Patient presents with   New Patient (Initial Visit)     Consult + LE Reflux. Bilat LE VV. Bleeding from varicose vein.  Glendia Shad.     HPI  Discussed the use of AI scribe software for clinical note transcription with the patient, who gave verbal consent to proceed.  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  In April 2025, she developed a small ulceration on her lower extremity. Manipulation of the scab in the shower resulted in brisk hemorrhage, which was controlled with direct pressure and wrapping. The site subsequently formed an erythematous, hemorrhagic papule. Dermatology performed a shave excision, leading to significant bleeding and a wound that required several months to heal, with persistent erythema and edema.  In October 2025, she sustained a minor abrasion to the same region from a pin, resulting in sanguineous drainage. She noted this response was disproportionate compared to similar trauma elsewhere. Since that time, she has not experienced further significant bleeding. She reports that the affected skin appears thinner and more prone to injury, but currently has no open wounds and notes ongoing healing.  She has been compliant with graduated compression stockings (15-20 mmHg) and has observed decreased prominence of superficial veins. She ambulates frequently and reports improvement in venous appearance since initiating compression therapy. She denies new or worsening bleeding, swelling, or other symptoms.    Results Lower extremity venous duplex ultrasound No evidence of venous reflux or thrombosis in lower extremity veins.   Review of Systems  Cardiovascular:  Negative for leg swelling.   Hematological:  Bruises/bleeds easily.  All other systems reviewed and are negative.      Objective:   Physical Exam Vitals reviewed.  HENT:     Head: Normocephalic.  Cardiovascular:     Rate and Rhythm: Normal rate.     Pulses: Normal pulses.  Pulmonary:     Effort: Pulmonary effort is normal.  Musculoskeletal:     Right lower leg: No edema.     Left lower leg: No edema.  Skin:    General: Skin is warm and dry.  Neurological:     Mental Status: She is alert and oriented to person, place, and time.  Psychiatric:        Mood and Affect: Mood normal.        Behavior: Behavior normal.        Thought Content: Thought content normal.        Judgment: Judgment normal.     Physical Exam EXTREMITIES: No significant hump. Small vein palpable.  BP 109/69   Pulse (!) 59   Resp 16   Ht 5' 3 (1.6 m)   Wt 102 lb 6.4 oz (46.4 kg)   BMI 18.14 kg/m   Past Medical History:  Diagnosis Date   Cataract    Hypercholesteremia 02/15/2022   Hypoglycemia    Osteoporosis 11/21/2017   Special screening for malignant neoplasms, colon    Tubal pregnancy     Social History   Socioeconomic History   Marital status: Married    Spouse name: Not on file   Number of children: Not on file   Years of education: Not on file   Highest education level: Bachelor's degree (e.g., BA,  AB, BS)  Occupational History   Not on file  Tobacco Use   Smoking status: Former    Current packs/day: 0.00    Types: Cigarettes    Quit date: 10/17/1972    Years since quitting: 52.0   Smokeless tobacco: Never  Vaping Use   Vaping status: Never Used  Substance and Sexual Activity   Alcohol use: No   Drug use: No   Sexual activity: Not on file  Other Topics Concern   Not on file  Social History Narrative   In [place]; with husband; worked as journalist, 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    Social Drivers of Health   Tobacco Use: Medium Risk (11/08/2024)   Patient  History    Smoking Tobacco Use: Former    Smokeless Tobacco Use: Never    Passive Exposure: Not on file  Financial Resource Strain: Low Risk (07/16/2024)   Overall Financial Resource Strain (CARDIA)    Difficulty of Paying Living Expenses: Not hard at all  Food Insecurity: Unknown (07/16/2024)   Epic    Worried About Programme Researcher, Broadcasting/film/video in the Last Year: Never true    [employer] of Food in the Last Year: Not on file  Transportation Needs: No Transportation Needs (07/16/2024)   Epic    Lack of Transportation (Medical): No    Lack of Transportation (Non-Medical): No  Physical Activity: Sufficiently Active (07/16/2024)   Exercise Vital Sign    Days of Exercise per Week: 7 days    Minutes of Exercise per Session: 50 min  Stress: No Stress Concern Present (07/16/2024)   [employer] of Occupational Health - Occupational Stress Questionnaire    Feeling of Stress: Not at all  Social Connections: Socially Integrated (07/16/2024)   Social Connection and Isolation Panel    Frequency of Communication with Friends and Family: Three times a week    Frequency of Social Gatherings with Friends and Family: Three times a week    Attends Religious Services: 1 to 4 times per year    Active Member of Clubs or Organizations: Yes    Attends Banker Meetings: 1 to 4 times per year    Marital Status: Married  Catering Manager Violence: Not At Risk (01/26/2024)   Humiliation, Afraid, Rape, and Kick questionnaire    Fear of Current or Ex-Partner: No    Emotionally Abused: No    Physically Abused: No    Sexually Abused: No  Depression (PHQ2-9): Low Risk (09/06/2024)   Depression (PHQ2-9)    PHQ-2 Score: 0  Alcohol Screen: Low Risk (01/26/2024)   Alcohol Screen    Last Alcohol Screening Score (AUDIT): 0  Housing: Low Risk (07/16/2024)   Epic    Unable to Pay for Housing in the Last Year: No    Number of Times Moved in the Last Year: 0    Homeless in the Last Year: No  Utilities: Not At Risk  (01/26/2024)   [employer] Utilities    Threatened with loss of utilities: No  Health Literacy: Adequate Health Literacy (01/26/2024)   B1300 Health Literacy    Frequency of need for help with medical instructions: Never    Past Surgical History:  Procedure Laterality Date   CATARACT EXTRACTION W/ INTRAOCULAR LENS IMPLANT Bilateral    COLONOSCOPY WITH PROPOFOL  N/A 12/19/2020   Procedure: COLONOSCOPY WITH PROPOFOL ;  Surgeon: Janalyn Keene NOVAK, MD;  Location: [facility];  Service: Endoscopy;  Laterality: N/A;  requests early priority  4   ECTOPIC PREGNANCY SURGERY     POLYPECTOMY N/A 12/19/2020   Procedure: POLYPECTOMY;  Surgeon: Janalyn Keene NOVAK, MD;  Location: [facility];  Service: Endoscopy;  Laterality: N/A;    Family History  Problem Relation Age of Onset   Cancer Mother 2       lung   Cancer Father 53       brain   Memory loss Sister     Allergies[1]     Latest Ref Rng & Units 10/25/2024   11:28 AM 09/06/2024    2:35 PM 02/27/2023    8:19 AM  CBC  WBC 4.0 - 10.5 K/uL 6.8  6.4  3.9   Hemoglobin 12.0 - 15.0 g/dL 87.2  87.3  86.9   Hematocrit 36.0 - 46.0 % 37.7  36.9  39.9   Platelets 150.0 - 400.0 K/uL 159.0  165.0  180       CMP     Component Value Date/Time   NA 139 09/06/2024 1435   NA 136 (A) 02/22/2015 0000   K 4.3 09/06/2024 1435   CL 103 09/06/2024 1435   CO2 28 09/06/2024 1435   GLUCOSE 89 09/06/2024 1435   BUN 30 (H) 09/06/2024 1435   BUN 24 (A) 02/22/2015 0000   CREATININE 0.54 09/06/2024 1435   CALCIUM 9.1 09/06/2024 1435   PROT 6.8 09/06/2024 1435   ALBUMIN 4.3 09/06/2024 1435   AST 28 09/06/2024 1435   ALT 22 09/06/2024 1435   ALKPHOS 66 09/06/2024 1435   BILITOT 0.4 09/06/2024 1435   GFR 89.26 09/06/2024 1435   GFRNONAA >60 02/27/2023 0819     No results found.     Assessment & Plan:   1. Varicose veins of both lower extremities, unspecified whether complicated (Primary) Varicose veins of lower extremity with  recurrent bleeding Two episodes of significant hemorrhage from superficial veins post-minor trauma. Duplex ultrasound showed no venous reflux or thrombosis. Atrophic skin at prior hemorrhage site increases future bleeding risk. Symptoms well-controlled with compression stockings. Conservative management expected to be effective. - Performed lower extremity venous duplex ultrasound to evaluate for venous reflux and thrombosis. - Recommended continued use of graduated compression stockings (15-20 mmHg). - Advised strict avoidance of trauma to areas of atrophic skin. - Discussed sclerotherapy as a potential intervention if recurrent hemorrhage occurs, but not indicated at this time. - Provided education regarding the natural history of skin remodeling post-venous wound and the associated increased risk of re-injury. - Instructed her to report any new or worsening hemorrhage, changes in venous consistency, or other concerning symptoms. - Reassured her regarding the benign nature of current findings and the commonality of her symptoms.   Assessment and Plan Assessment & Plan      Medications Ordered Prior to Encounter[2]  There are no Patient Instructions on file for this visit. No follow-ups on file.   Fahima Cifelli E Jozelyn Kuwahara, NP      [1]  Allergies Allergen Reactions   Neomycin-Polymyxin-Dexameth Other (See Comments)    Migraine after use    Sulfa Antibiotics Rash  [2]  Current Outpatient Medications on File Prior to Visit  Medication Sig Dispense Refill   Biotin 5 MG TABS Take 10 mg by mouth daily.     Ca Cit Malate-Cholecalciferol (CALCIUM CITRATE MALATE-VIT D PO) Take 3 tablets by mouth in the morning and at bedtime.     Cod Liver Oil CAPS Take 1 capsule by mouth daily.     Magnesium Oxide 420 (252 Mg)  MG TABS Take 420 mg by mouth every evening.     Multiple Vitamins-Minerals (PX COMPLETE SENIOR MULTIVITS) TABS Take 1 tablet by mouth daily.     No current facility-administered  medications on file prior to visit.   "

## 2024-11-13 ENCOUNTER — Ambulatory Visit: Payer: Self-pay | Admitting: Internal Medicine

## 2024-11-16 ENCOUNTER — Telehealth: Payer: Self-pay | Admitting: Internal Medicine

## 2024-11-16 DIAGNOSIS — E7800 Pure hypercholesterolemia, unspecified: Secondary | ICD-10-CM

## 2024-11-16 DIAGNOSIS — D72821 Monocytosis (symptomatic): Secondary | ICD-10-CM

## 2024-11-16 NOTE — Telephone Encounter (Signed)
 E2C2 - when patient calls back, please assist her with scheduling an appointment for non-fasting labs 1-2 days prior to her appointment with Dr. Allena Hamilton on 03/03/2025.  If patient states she would like to have her labs drawn at another location (not our clinic), then please send a note to our clinical staff, so her orders may be adjusted.

## 2024-11-16 NOTE — Telephone Encounter (Signed)
 Thank you :)

## 2024-11-16 NOTE — Telephone Encounter (Signed)
-----   Message from Suzen MATSU sent at 11/15/2024 10:21 AM EST ----- Regarding: RE: f/u appt Hi Dr. Glendia-  I spoke with Sari and scheduled a follow-up appointment for her with you on 03/03/2025.  Zaneta asked if you would like for her to have labs prior to this visit.  I don't see lab orders in the system, so please let me know if we need to schedule a lab visit.  Also, according to her check-out note from her last visit, I scheduled a physical for her on 09/14/2025.  Thanks, Luke ----- Message ----- From: Glendia Shad, MD Sent: 11/13/2024   1:21 PM EST To: Suzen SHAUNNA Dec Subject: f/u appt                                       Please schedule for f/u appn in 02/2025.  Thanks.    Dr Glendia

## 2024-11-16 NOTE — Telephone Encounter (Signed)
 Labs ordered. Please schedule non fasting lab 1-2 days before scheduled appt. Thanks.

## 2025-01-31 ENCOUNTER — Ambulatory Visit

## 2025-03-03 ENCOUNTER — Ambulatory Visit: Admitting: Internal Medicine

## 2025-09-07 ENCOUNTER — Encounter: Admitting: Internal Medicine

## 2025-09-14 ENCOUNTER — Encounter: Admitting: Internal Medicine
